# Patient Record
Sex: Male | Born: 1965 | Race: Black or African American | Hispanic: No | Marital: Married | Smoking: Never smoker
Health system: Southern US, Community
[De-identification: ages and names within clinical notes are randomized; demographics above are authoritative.]

## PROBLEM LIST (undated history)

## (undated) DIAGNOSIS — I10 Essential (primary) hypertension: Secondary | ICD-10-CM

## (undated) DIAGNOSIS — I119 Hypertensive heart disease without heart failure: Secondary | ICD-10-CM

## (undated) DIAGNOSIS — G47419 Narcolepsy without cataplexy: Secondary | ICD-10-CM

## (undated) DIAGNOSIS — K219 Gastro-esophageal reflux disease without esophagitis: Secondary | ICD-10-CM

## (undated) DIAGNOSIS — R0600 Dyspnea, unspecified: Secondary | ICD-10-CM

## (undated) DIAGNOSIS — G629 Polyneuropathy, unspecified: Secondary | ICD-10-CM

## (undated) DIAGNOSIS — Z9989 Dependence on other enabling machines and devices: Secondary | ICD-10-CM

## (undated) DIAGNOSIS — I422 Other hypertrophic cardiomyopathy: Secondary | ICD-10-CM

## (undated) DIAGNOSIS — R5383 Other fatigue: Secondary | ICD-10-CM

## (undated) DIAGNOSIS — G4733 Obstructive sleep apnea (adult) (pediatric): Secondary | ICD-10-CM

## (undated) DIAGNOSIS — Z9889 Other specified postprocedural states: Secondary | ICD-10-CM

## (undated) HISTORY — DX: Other fatigue: R53.83

## (undated) HISTORY — DX: Narcolepsy without cataplexy: G47.419

## (undated) HISTORY — DX: Other specified postprocedural states: Z98.890

## (undated) HISTORY — DX: Dyspnea, unspecified: R06.00

## (undated) HISTORY — PX: CATARACT EXTRACTION: SUR2

---

## 1998-02-26 HISTORY — PX: VASECTOMY: SHX75

## 2001-11-06 ENCOUNTER — Ambulatory Visit (HOSPITAL_BASED_OUTPATIENT_CLINIC_OR_DEPARTMENT_OTHER): Admission: RE | Admit: 2001-11-06 | Discharge: 2001-11-06 | Payer: Self-pay | Admitting: *Deleted

## 2002-01-11 ENCOUNTER — Encounter: Payer: Self-pay | Admitting: *Deleted

## 2002-01-12 ENCOUNTER — Ambulatory Visit (HOSPITAL_COMMUNITY): Admission: RE | Admit: 2002-01-12 | Discharge: 2002-01-13 | Payer: Self-pay | Admitting: *Deleted

## 2003-08-19 ENCOUNTER — Encounter: Admission: RE | Admit: 2003-08-19 | Discharge: 2003-08-19 | Payer: Self-pay | Admitting: Family Medicine

## 2010-06-28 DIAGNOSIS — G629 Polyneuropathy, unspecified: Secondary | ICD-10-CM

## 2010-06-28 HISTORY — DX: Polyneuropathy, unspecified: G62.9

## 2010-10-18 ENCOUNTER — Emergency Department (HOSPITAL_COMMUNITY): Payer: BC Managed Care – PPO

## 2010-10-18 ENCOUNTER — Inpatient Hospital Stay (HOSPITAL_COMMUNITY)
Admission: EM | Admit: 2010-10-18 | Discharge: 2010-10-26 | DRG: 018 | Disposition: A | Discharge status: Home Health | Payer: BC Managed Care – PPO | Attending: Internal Medicine | Admitting: Internal Medicine

## 2010-10-18 DIAGNOSIS — G4733 Obstructive sleep apnea (adult) (pediatric): Secondary | ICD-10-CM | POA: Diagnosis present

## 2010-10-18 DIAGNOSIS — R42 Dizziness and giddiness: Secondary | ICD-10-CM | POA: Diagnosis present

## 2010-10-18 DIAGNOSIS — E669 Obesity, unspecified: Secondary | ICD-10-CM | POA: Diagnosis present

## 2010-10-18 DIAGNOSIS — J342 Deviated nasal septum: Secondary | ICD-10-CM | POA: Diagnosis present

## 2010-10-18 DIAGNOSIS — R509 Fever, unspecified: Secondary | ICD-10-CM | POA: Diagnosis present

## 2010-10-18 DIAGNOSIS — G609 Hereditary and idiopathic neuropathy, unspecified: Principal | ICD-10-CM | POA: Diagnosis present

## 2010-10-18 DIAGNOSIS — I10 Essential (primary) hypertension: Secondary | ICD-10-CM | POA: Diagnosis present

## 2010-10-18 DIAGNOSIS — G729 Myopathy, unspecified: Secondary | ICD-10-CM | POA: Diagnosis present

## 2010-10-18 LAB — COMPREHENSIVE METABOLIC PANEL
ALT: 23 U/L (ref 0–53)
AST: 31 U/L (ref 0–37)
Albumin: 4.1 g/dL (ref 3.5–5.2)
CO2: 24 mEq/L (ref 19–32)
Calcium: 9.6 mg/dL (ref 8.4–10.5)
Creatinine, Ser: 1.51 mg/dL — ABNORMAL HIGH (ref 0.4–1.5)
GFR calc Af Amer: >60 mL/min (ref >60)
GFR calc non Af Amer: 51 mL/min — ABNORMAL LOW (ref >60)
Sodium: 134 mEq/L — ABNORMAL LOW (ref 135–145)

## 2010-10-18 LAB — CBC
HCT: 41.3 % (ref 39.0–52.0)
Hemoglobin: 14.2 g/dL (ref 13.0–17.0)
MCH: 27.4 pg (ref 26.0–34.0)
MCHC: 34.4 g/dL (ref 30.0–36.0)
MCV: 79.7 fL (ref 78.0–100.0)
Platelets: 355 10*3/uL (ref 150–400)
RBC: 5.18 MIL/uL (ref 4.22–5.81)
RDW: 12.2 % (ref 11.5–15.5)
WBC: 13.1 10*3/uL — ABNORMAL HIGH (ref 4.0–10.5)

## 2010-10-18 LAB — TROPONIN I: Troponin I: 0.02 ng/mL (ref 0.00–0.06)

## 2010-10-18 LAB — URINALYSIS, ROUTINE W REFLEX MICROSCOPIC
Bilirubin Urine: NEGATIVE
Leukocytes, UA: NEGATIVE
Nitrite: NEGATIVE
Specific Gravity, Urine: 1.012 (ref 1.005–1.030)
Urobilinogen, UA: 0.2 mg/dL (ref 0.0–1.0)
pH: 5.5 (ref 5.0–8.0)

## 2010-10-18 LAB — DIFFERENTIAL
Basophils Absolute: 0 10*3/uL (ref 0.0–0.1)
Basophils Relative: 0 % (ref 0–1)
Eosinophils Absolute: 0 10*3/uL (ref 0.0–0.7)
Eosinophils Relative: 0 % (ref 0–5)
Lymphocytes Relative: 10 % — ABNORMAL LOW (ref 12–46)
Lymphs Abs: 1.3 10*3/uL (ref 0.7–4.0)
Monocytes Absolute: 1.6 10*3/uL — ABNORMAL HIGH (ref 0.1–1.0)
Monocytes Relative: 12 % (ref 3–12)
Neutro Abs: 10.2 10*3/uL — ABNORMAL HIGH (ref 1.7–7.7)
Neutrophils Relative %: 78 % — ABNORMAL HIGH (ref 43–77)

## 2010-10-18 LAB — POCT CARDIAC MARKERS
Myoglobin, poc: 208 ng/mL (ref 12–200)
Troponin i, poc: <0.05 ng/mL (ref 0.00–0.09)

## 2010-10-18 LAB — MAGNESIUM: Magnesium: 2.4 mg/dL (ref 1.5–2.5)

## 2010-10-18 LAB — URINE MICROSCOPIC-ADD ON

## 2010-10-18 LAB — CK TOTAL AND CKMB (NOT AT ARMC): Relative Index: 1.2 (ref 0.0–2.5)

## 2010-10-18 LAB — HEMOGLOBIN A1C: Mean Plasma Glucose: 126 mg/dL — ABNORMAL HIGH (ref <117)

## 2010-10-18 MED ORDER — GADOBENATE DIMEGLUMINE 529 MG/ML IV SOLN
20.0000 mL | Freq: Once | INTRAVENOUS | Status: AC | PRN
  Administered 2010-10-18: 20 mL via INTRAVENOUS

## 2010-10-18 NOTE — Consult Note (Signed)
NAMEMAIKEL, NEISLER                 ACCOUNT NO.:  [id number]  MEDICAL RECORD NO.:  [id number]           PATIENT TYPE:  E  LOCATION:  [facility]                         FACILITY:  [facility]  PHYSICIAN:  Levie Heritage, MD       DATE OF BIRTH:  September 20, 1965  DATE OF CONSULTATION:  10/18/2010 DATE OF DISCHARGE:                                CONSULTATION   REFERRING PHYSICIAN:  Dr. Dellia Nims.  REASON FOR CONSULTATION:  Leg weakness.  CHIEF COMPLAINT:  Leg weakness.  HISTORY OF PRESENT ILLNESS:  The patient is a 45 year old African American man with no significant past medical history who has been having respiratory tract infection like picture for almost a week now. On this Friday, October 16, 2010, he noted generalized weakness and went to his primary care physician on 21st and was told to have some pneumonia like picture for what he was treated as well.  As per the wife and the patient, since then he has been having problem walking and feel generalized weak all over in addition to being a little confused as well.  There is also some issue with the bladder control problem that started yesterday and that brought him into the emergency department.  A CT scan of the brain was performed that is not showing any acute intracranial abnormality.  Currently, the patient denies any sensory deficits.  He did have generalized weakness throughout this course as above and has also vomited one time.  He states in addition to generalized weakness, he feels more weaker in his legs than the rest, however, denies any visual or sensory issues.  PAST MEDICAL HISTORY:  The patient has obstructive sleep apnea, nasal deviation, and inferior turbinate hypertrophy and he uses a CPAP at bedtime.  MEDICATIONS:  No regular outpatient medications.  ALLERGIES:  No allergies to any medications.  SOCIAL HISTORY:  Currently unemployed, has 2 children, lives with wife. There is no history of smoking, alcohol, or other  illicit drug abuse.  FAMILY HISTORY:  There is no nerve or muscle disorders in the family.  REVIEW OF SYSTEMS:  Currently denies any pain but feels lethargic all over.  Denies any shortness of breath.  Denies any chest pain.  Denies any yellowness of his skin.  Denies any rashes.  He does have fevers for almost a week now with chills and febrile.  No problem hearing.  No issues with the vision.  He vomited one time.  Denies any loose stools. The rest of 10 organ review of system unremarkable except for those mentioned above.  REVIEW OF CLINICAL DATA:  I have reviewed his CT scan of the head and have found no acute abnormality.  I have reviewed his labs suggestive of increased WBC counts and a CMP showing increased creatinine and glucose. The UA does not suggest of any urinary tract infection.  Has a normal value of magnesium.  PHYSICAL EXAMINATION:  GENERAL:  Comfortably lying down on the bed currently with vitals 128/68 mmHg and a pulse of 76 per minute.  Awake, oriented x3.  No acute distress. HEENT:  Bilateral pupils reactive to  light and accommodation with no field cut.  Moves eyes to all direction.  Symmetrical face within poor sensation and strength testing.  Midline tongue without atrophy or fasciculation.  Palate elevates symmetrically bilaterally. NEUROLOGIC:  Shoulder shrug intact both sides.  Motor:  The strength is intact in both arms, 5/5.  The strength in the legs is 1-2/5 ranges, however, there is lack of efforts noted on the Hoover sign as well. However, weakness from the dorsal ganglion would also give the similar picture from the lumbosacral rootlets.  Deep tendon reflexes are brisker, 3+ all over.  Sensory:  Feels intact light touch all over symmetrically without any alteration of sensation. Gait was not tested.  IMPRESSION:  This is a 45 year old man with 1 week history of respiratory tract infection with fevers, chills, and generalized weakness more so in the  legs than the rest. The intact deep tendon reflexes argue against the GBS, however, the intact sensation is all over and the lack of a sensory level localizes the lesion at the dorsal root ganglion or proximal to that but outside the spinal cord column. F - waves latency prolongation on the nerve conduction studies could better localize the lesion at this time, however, it is not performed as an inpatient.  The possibility of acute inflammatory demyelinating neuropathy cannot be ruled out in this setting, although the reflexes are intact.  However, infectious etiology is major differential as compared to the immune mediator and the management should be directed towards that at this time.  PLAN:  I have discussed the details of impression with Dr. Lovell Sheehan and have suggested getting the MRI of the lumbosacral spine with contrast. I have suggested to perform lumbar puncture for the differential cell counts, protein, glucose in addition to the infectious workup. Neurology will follow the patient with you as the case progresses. the followup on strength testing examination along with the deep tendon reflexes and above lab work will shed more light on the possibility of the GBS, however, at this point we will hold the treatment for that.          ______________________________ Levie Heritage, MD     WS/MEDQ  D:  10/18/2010  T:  10/18/2010  Job:  [id number]  Electronically Signed by Levie Heritage MD on 10/18/2010 02:05:34 PM

## 2010-10-19 ENCOUNTER — Inpatient Hospital Stay (HOSPITAL_COMMUNITY): Payer: BC Managed Care – PPO

## 2010-10-19 LAB — CBC
HCT: 41.3 % (ref 39.0–52.0)
Hemoglobin: 13.6 g/dL (ref 13.0–17.0)
MCH: 27.1 pg (ref 26.0–34.0)
RBC: 5.02 MIL/uL (ref 4.22–5.81)

## 2010-10-19 LAB — DIFFERENTIAL
Basophils Absolute: 0 10*3/uL (ref 0.0–0.1)
Basophils Relative: 0 % (ref 0–1)
Eosinophils Absolute: 0.1 10*3/uL (ref 0.0–0.7)
Lymphocytes Relative: 25 % (ref 12–46)
Monocytes Relative: 12 % (ref 3–12)
Neutro Abs: 5.9 10*3/uL (ref 1.7–7.7)
Neutrophils Relative %: 62 % (ref 43–77)

## 2010-10-19 LAB — URINE CULTURE: Culture: NO GROWTH

## 2010-10-20 ENCOUNTER — Inpatient Hospital Stay (HOSPITAL_COMMUNITY): Payer: BC Managed Care – PPO

## 2010-10-20 DIAGNOSIS — G8220 Paraplegia, unspecified: Secondary | ICD-10-CM

## 2010-10-20 DIAGNOSIS — R509 Fever, unspecified: Secondary | ICD-10-CM

## 2010-10-20 LAB — BASIC METABOLIC PANEL
BUN: 12 mg/dL (ref 6–23)
CO2: 31 mEq/L (ref 19–32)
Chloride: 100 mEq/L (ref 96–112)
Creatinine, Ser: 0.95 mg/dL (ref 0.4–1.5)
Glucose, Bld: 104 mg/dL — ABNORMAL HIGH (ref 70–99)

## 2010-10-20 LAB — DIFFERENTIAL
Lymphs Abs: 2.3 10*3/uL (ref 0.7–4.0)
Monocytes Absolute: 0.9 10*3/uL (ref 0.1–1.0)
Monocytes Relative: 11 % (ref 3–12)
Neutro Abs: 4.7 10*3/uL (ref 1.7–7.7)
Neutrophils Relative %: 59 % (ref 43–77)

## 2010-10-20 LAB — CBC
Hemoglobin: 14 g/dL (ref 13.0–17.0)
MCH: 27.4 pg (ref 26.0–34.0)
MCHC: 33.2 g/dL (ref 30.0–36.0)
MCV: 82.6 fL (ref 78.0–100.0)
RBC: 5.11 MIL/uL (ref 4.22–5.81)

## 2010-10-20 LAB — CSF CELL COUNT WITH DIFFERENTIAL
Monocyte-Macrophage-Spinal Fluid: 1 % — ABNORMAL LOW (ref 15–45)
RBC Count, CSF: 3 /mm3 — ABNORMAL HIGH
Tube #: 1

## 2010-10-20 MED ORDER — GADOBENATE DIMEGLUMINE 529 MG/ML IV SOLN
20.0000 mL | Freq: Once | INTRAVENOUS | Status: AC | PRN
  Administered 2010-10-20: 20 mL via INTRAVENOUS

## 2010-10-21 ENCOUNTER — Inpatient Hospital Stay (HOSPITAL_COMMUNITY): Payer: BC Managed Care – PPO

## 2010-10-21 LAB — PATHOLOGIST SMEAR REVIEW

## 2010-10-21 LAB — HIV ANTIBODY (ROUTINE TESTING W REFLEX): HIV: NONREACTIVE

## 2010-10-21 LAB — HERPES SIMPLEX VIRUS(HSV) DNA BY PCR: HSV 2 DNA: NOT DETECTED

## 2010-10-22 LAB — HIV-1 RNA ULTRAQUANT REFLEX TO GENTYP+
HIV 1 RNA Quant: <20 copies/mL (ref <20)
HIV-1 RNA Quant, Log: <1.3 {Log} (ref <1.30)

## 2010-10-23 DIAGNOSIS — G92 Toxic encephalopathy: Secondary | ICD-10-CM

## 2010-10-23 DIAGNOSIS — G929 Unspecified toxic encephalopathy: Secondary | ICD-10-CM

## 2010-10-23 DIAGNOSIS — R5381 Other malaise: Secondary | ICD-10-CM

## 2010-10-23 LAB — CK TOTAL AND CKMB (NOT AT ARMC)
Relative Index: INVALID (ref 0.0–2.5)
Total CK: 64 U/L (ref 7–232)

## 2010-10-23 LAB — COMPREHENSIVE METABOLIC PANEL
CO2: 29 mEq/L (ref 19–32)
Calcium: 9.5 mg/dL (ref 8.4–10.5)
Creatinine, Ser: 0.86 mg/dL (ref 0.4–1.5)
GFR calc non Af Amer: >60 mL/min (ref >60)
Glucose, Bld: 96 mg/dL (ref 70–99)
Total Protein: 7.3 g/dL (ref 6.0–8.3)

## 2010-10-23 LAB — CYTOMEGALOVIRUS PCR, QUALITATIVE: Cytomegalovirus DNA: NOT DETECTED

## 2010-10-23 LAB — CSF CULTURE W GRAM STAIN: Culture: NO GROWTH

## 2010-10-23 LAB — ANGIOTENSIN CONVERTING ENZYME: Angiotensin-Converting Enzyme: 42 U/L (ref 8–52)

## 2010-10-23 LAB — SEDIMENTATION RATE: Sed Rate: 37 mm/hr — ABNORMAL HIGH (ref 0–16)

## 2010-10-23 LAB — PSA: PSA: 0.62 ng/mL (ref <=4.00)

## 2010-10-23 LAB — C-REACTIVE PROTEIN: CRP: 1 mg/dL — ABNORMAL HIGH (ref <0.6)

## 2010-10-23 LAB — VITAMIN B12: Vitamin B-12: 756 pg/mL (ref 211–911)

## 2010-10-24 ENCOUNTER — Inpatient Hospital Stay (HOSPITAL_COMMUNITY): Payer: BC Managed Care – PPO

## 2010-10-24 LAB — CULTURE, BLOOD (ROUTINE X 2)
Culture  Setup Time: 201204221124
Culture: NO GROWTH

## 2010-10-24 LAB — COPPER, SERUM: Copper: 151 ug/dL (ref 70–175)

## 2010-10-26 LAB — B. BURGDORFI ANTIBODIES: B burgdorferi Ab IgG+IgM: 0.16 {ISR}

## 2010-10-26 NOTE — Consult Note (Signed)
Kyle Cross, Kyle Cross                 ACCOUNT NO.:  [id number]  MEDICAL RECORD NO.:  [id number]           PATIENT TYPE:  I  LOCATION:  3738                         FACILITY:  [facility]  PHYSICIAN:  Acey Lav, MD  DATE OF BIRTH:  09/02/1965  DATE OF CONSULTATION:  10/20/2010 DATE OF DISCHARGE:                                CONSULTATION   REQUESTING PHYSICIAN:  Altha Harm, MD.  REASON FOR INFECTION DISEASE CONSULTATION:  The patient with lower extremity paralysis, fevers, and intermittent confusion.  HISTORY OF PRESENT ILLNESS:  Kyle Cross is a 45 year old African American gentleman with past medical history significant for obstructive sleep apnea and hypertension, who had been doing relatively well until approximately 10 days prior to admission, at which point in time, he developed fevers, chills, myalgias, and malaise with a nonproductive cough.  He and his wife thought that he had the flu and he was being treated with antipyretics including ibuprofen and acetaminophen.  This improved over the weekend, and by the Monday and Tuesday, he was feeling better and began mowing his grass.  He then again felt poorly and noticed that he had again become febrile.  He previously had temperature of 102 degrees Fahrenheit, now is again up to 100 and 102 degrees. Malaise persisted, and by Saturday April 21, he had noticed that he was having difficulty with his balance and was feeling dizzy on his feet. He sought care with his primary care physician at [facility], who performed a chest x-ray.  Chest x-ray was apparently read as being consistent with some bronchitis and he was given a dose of Rocephin intramuscularly along with oral Avelox, which he took on Saturday.  Symptoms worsened on Sunday and he fell at home twice.  Both of these were due to his strength giving out on him.  He said that he already had been leaning up against the wall to try and support him.  Finally, when he had  fallen the last time, his wife had found him on the floor near the bed.  She called 911 and brought him to the hospital urgently.  He was admitted to the Hospitalist Service and was seen by Neurology as well.  His admission labs were pertinent for mild leukocytosis and slight renal insufficiency, otherwise relatively unremarkable labs.  He had a noncontrasted CT of the head done which did not show any acute intracranial abnormalities.  He had a chest x-ray done, which showed some mild cardiomegaly and vascular congestion.  He underwent an MRI of the lumbar spine without and with contrast that showed some disk bulging of the facet ligamentous hypertrophy from L3-4 and L5-S1, but no disk herniation or stenosis or nerve root encroachment.  There is no evidence of diskitis at all.  Ultimately, he underwent a lumbar puncture today under fluoroscopy.  Opening pressure was 17 cm of fluid and 8 mL of spinal fluid was removed.  Spinal fluid composition revealed 145 white blood cells with 3 red cells with 99% lymphocytes, 1% monocytes.  His cryptococcal antigen in spinal fluid was negative.  Protein in spinal fluid was 79 and  glucose was normal at 61.  Serum glucose today was 104. Since admission to the hospital, the patient has been completely afebrile.  He was found on admission to have urinary retention, had a catheter placed.  His lower extremity weakness does not appear to have improved at all since admission.  We were asked to see the patient and assist in workup of this patient with history of recent febrile illness and onset of lower extremity paralysis.  PAST MEDICAL HISTORY: 1. Obstructive sleep apnea with CPAP that he uses at night. 2. Nasal deviation with inferior turbinate hypertrophy. 3. Hypertension.  PAST SURGICAL HISTORY:  None.  ALLERGIES:  No known drug allergy.  MEDICATIONS:  Currently, the patient is on amlodipine, Tylenol, metoprolol, and Zofran.  REVIEW OF SYSTEMS:   As described in history of present illness, otherwise 12-point review of systems is negative.  PHYSICAL EXAMINATION:  VITAL SIGNS:  Temperature maximum is 98.2 in the last 24 hours, temperature current 97.9, blood pressure 122/79, pulse 67, respirations 22, pulse ox is 93% on room air. GENERAL:  Pleasant gentleman, who is overweight. HEENT:  Normocephalic.  His pupils are equal, round, reactive to light. His sclerae are anicteric.  His conjunctivae are clear.  His oropharynx is clear without exudate or lesions. NECK:  Thick.  No significant cervical lymphadenopathy. CARDIOVASCULAR:  Reveals a regular rate and rhythm.  No murmurs, gallops, or rubs heard. LUNGS:  Relatively clear to auscultation.  He did have some few coarse upper airway sounds that sounded more than his neck on examination initially. ABDOMEN:  Soft with some mild suprapubic tenderness that he thinks is due to the Foley catheter not being placed right. SKIN:  He has some areas where he has been scratched, but no significant rashes or petechiae identified.  No splinter hemorrhages or Janeway lesions. NEUROLOGIC:  His cranial nerves II through XII are grossly intact.  Hiscerebellar function is intact by finger-to-nose testing.  His upper extremity strength is 5/5 bilaterally.  His hip flexion is actually 3/5 on the right 2/5 on the left.  Similarly, flexion about the knee is 3/5 in the right and 2/5 in the left.  Dorsiflexion and extension are 3/5 bilaterally.  Sensory exam, he has hyperesthesia in his palms of the feet and dorsal aspect of his feet, but normal sensation above this level.  His deep tendon reflexes are 3+ throughout.  LABORATORY DATA:  Images as described above.  Spinal fluid composition as described above.  Metabolic panel today, sodium 140, potassium 3.8, chloride 100, bicarb 31, BUN and creatinine 12 and 0.95, glucose 104.  CBC differential, white count 8, hemoglobin 14, platelets 334, ANC of 4.7,  ALC of 2.3.  Thyroid stimulating hormone 0.825.  Urinalysis showed negative urinalysis.  Cardiac biomarkers were negative.  Liver function tests were normal on admission.  Microbiological data, urine culture on April 22, no growth.  Blood cultures in one site, no growth.  IMPRESSION AND RECOMMENDATIONS:  This is a 45 year old African American gentleman with febrile illness, occurred 10 days ago, that persisted and then developed into lower extremity paralysis. 1. Lower extremity paralysis in the setting of recent febrile illness:     Differential for this certainly would include viral infection such     as arboviruses including [place] Nile virus, [place] encephalitis     virus, eastern equine encephalitis virus, western equine     encephalitis virus, and [place] equine encephalitis virus,     although some of these are less common in  this area.  Certainly, an     enterovirus infection is also a possibility.  Treatable herpes     viruses would be possible, but would seem did not be very likely     herpes simplex upon can certainly cause encephalitis, although, the     picture here with his lower extremity paralysis does not consistent     with a typical presentation of herpes simplex encephalitis.  Herpes     simplex type 2, which would present with meningitis.  Varicella     zoster virus certainly could do this, and in the appropriate     setting, he does lacks atypical antecedent eruption of zoster.     Should he have human immunodeficiency virus, then cytomegalovirus     infection would be possible.  Certainly, CNS pleocytosis can occur     in the setting of tick-borne infection such as [place]     spotted fever and Ehrlichia, however, given lack of significant lab     abnormalities such as thrombocytopenia, transaminase elevation, and     the resolution of his fever, I would find these to be unlikely.     Bacterial infection is highly unlikely given the fact that he  has     gone on for approximately 12 days without deteriorating in the     absence of significant antibiotic exposure.  It is in the realm of     possibility, it could have apparent meningeal focus of infection     such as a brain abscess or abscess higher up in the cord     potentially in the thoracic level.  Certainly, neurosyphilis could     potentially this as well.  Acute human immunodeficiency virus could     as well.  Other possibilities would be a post infectious pathology     such as acute demyelinating encephalomyelitis or Guillain-Barre     syndrome.  Other noninfectious entities such as MS.  I would     recommend getting a stat MRI of the brain as this might elucidate     possible pathology and differentiate between a possible post-     infectious process such as acute disseminated encephalomyelitis     versus evidence of viral encephalitis such as cytomegalovirus     infection, or herpes simplex type 1 infection.  I will send his     spinal fluid up which there is approximately 10 mL left off first     for herpes simplex by PCR, varicella zoster by PCR, and     cytomegalovirus by PCR.  I will check a stat serum, RPR with     delusions of present effect.  I will check a stat HIV, antibody,     and RNA.  At this point in time, I would like to defer giving him     antiviral therapy as my suspicion is low for herpes simplex     encephalitis and low for cytomegalovirus infection.  Certainly,     should his HIV test be positive or should the MRI be highly     suggestive of viral encephalitis, I would consider starting him on     ganciclovir for coverage for CMV-HCA and VZV versus acyclovir for     HSV and VZV.  We can send a serum panel for arbovirus, and then we     could send the spinal fluid if we have, and even at this point in     time,  however, I would like to hold off on sending such testing as     these would not be treatable with antiviral therapies.  Thank you for  Infectious Disease consultation.  We will continue to follow along.     Acey Lav, MD     CV/MEDQ  D:  10/20/2010  T:  10/21/2010  Job:  [id number]  cc:   Levie Heritage, MD  Electronically Signed by Paulette Blanch DAM MD on 10/26/2010 08:58:41 PM

## 2010-11-04 LAB — ARBOVIRUS PANEL, ~~LOC~~ LAB

## 2010-11-04 NOTE — H&P (Signed)
NAMEGIOVONI, BUNCH                 ACCOUNT NO.:  [id number]  MEDICAL RECORD NO.:  [id number]           PATIENT TYPE:  I  LOCATION:  3738                         FACILITY:  [facility]  PHYSICIAN:  Altha Harm, MDDATE OF BIRTH:  06/11/1966  DATE OF ADMISSION:  10/18/2010 DATE OF DISCHARGE:                             HISTORY & PHYSICAL   CHIEF COMPLAINT:  Dizziness, disorientation, fevers x1 week.  HISTORY OF PRESENT ILLNESS:  Kyle Cross is a 45 year old gentleman who presents to the emergency room with paraplegia of sudden onset. According to the patient's wife, the patient has been having fevers for 1 week and was seen by his primary care physician and received a shot of Rocephin, also prescription for Avelox on yesterday to treat his upper respiratory infection.  According to the patient's wife, the patient was in his usual state of strength, ambulating and even working on his riding lawn more up until the day before presentation.  However, the patient on the day before noticed he was having some weakness and on the morning of presentation, felt that he was unable to move his legs or stand.  EMS was called and the patient was brought to the emergency room.  The patient's wife relates a fever as high as 103.  Fever was treated with Motrin with good results.  The patient also had some urinary retention for over 24 hours which was relieved with a Foley catheter placed in the emergency room.  The patient denies any rashes. He denies any weight loss or weight gain.  He denies any nausea, vomiting, or diarrhea.  He denies any chest pain.  The patient did sustain a fall on the morning of presentation secondary to the weakness of his bilateral lower extremities.  PAST MEDICAL HISTORY:  Significant obstructive sleep apnea.  FAMILY HISTORY:  Hypertension in the mother, diabetes in his father.  SOCIAL HISTORY:  The patient is currently unemployed.  He resides with his wife Ava, who is  present at the bedside.  There is no tobacco, alcohol, or drug use.  Medications at home include the following: 1. Multivitamin 1 tab p.o. daily. 2. Ibuprofen 200-400 mg p.o. q.8 hours p.r.n. 3. Avelox 400 mg p.o. daily.  The patient took his first dose last     night, has not taken any further medication since.  ALLERGIES:  No known drug allergies.  PRIMARY CARE PHYSICIAN:  Bryan Lemma. Manus Gunning, MD, at [place].  REVIEW OF SYSTEMS:  All other systems negative except as noted in the HPI.  STUDIES IN THE EMERGENCY ROOM: 1. CT of the head was negative for any acute intracranial     abnormalities. 2. Chest x-ray is negative for any acute cardiopulmonary process.  Hemogram shows a white blood cell count of 13.1, hemoglobin of 14.2, hematocrit of 41.3, platelet count of 355.  Sodium is 134, potassium 4.2, chloride 99, bicarb 24, BUN 70, creatinine 1.51.  PHYSICAL EXAMINATION:  GENERAL:  The patient is laying in bed.  He is easily arousable, but has a very distant affect and is not eager to participate in conversation. VITAL SIGNS:  Temperature  is 98, heart rate 83, blood pressure 185/81, respiratory rate 21, O2 sats are 95% on room air. HEENT:  Normocephalic, atraumatic.  Pupils equally round and reactive to light and accommodation.  Fundi are benign.  Extraocular movements are intact.  Oropharynx is moist with no exudate, erythema, or lesions are noted. NECK:  Trachea is midline.  No masses, no thyromegaly, no JVD.  No carotid bruit. RESPIRATORY:  The patient has normal respiratory effort, equal excursion bilaterally.  No wheezes, no rhonchi noted. CARDIOVASCULAR:  He has got a normal S1 and S2.  No murmurs, rubs, or gallops are noted.  PMI is nondisplaced.  No heaves or thrills on palpation. ABDOMEN:  Obese, soft, nontender, nondistended.  No masses, no hepatosplenomegaly noted. NEUROLOGICAL:  The patient has normal rectal tone.  DTR's are mildly hyperreflexic at 3+ in bilateral  lower and upper extremities.  The patient is unable to initiate movement in the lower extremity, however, does not appear to be exerting any effort.  The strength that he exhibits on examination is 1/5. MUSCULOSKELETAL:  Sensation is intact to light touch throughout the bilateral upper and lower extremities. LYMPH NODE SURVEY:  He has got no cervical, axillary, inguinal lymphadenopathy noted. SKIN:  Shows no rashes.  It is warm and dry throughout.  ASSESSMENT AND PLAN:  This patient who presents with paraplegia of unknown etiology.  There is a question about whether this could represent Gullian-Barre.  However, the hyperreflexia seen in this patient is inconsistent with the presentation of Gullian-Barre.  Dr. Hoy Morn, Neurology has been consulted to see this patient and I will defer to his recommendations on the paraplegia.  The patient has had some urinary retention and although his rectal tone was within normal limits, I feel it is important to be eliminate spinal pathology the cause of this.  I am ordering a thoracic lumbosacral MRI with and without contrast to further evaluate the spine.  The patient has been noted to have had some fevers if the MRI does not reveal any implicating pathology, then I will proceed with a lumbar puncture to evaluate CSF for any intracerebral causes of this.  Hypertension.  The patient is not known to have a diagnosis of hypertension, however, he is hypertensive here and we will start with injectables at this point and if the hypertension process will proceed with oral medications to better control his blood pressures.  Right now, the patient is afebrile and I do not see an indication for continuing any antibiotics as the patient exhibits no signs of an upper respiratory infection or any other overt infection.  I think that until the patient has a lumbar puncture, it will be prudent to hold off on any antibiotics unless the patient deteriorates  clinically and empiric antibiotics to cover intracranial pathogens would be started. The patient will receive Lovenox for DVT prophylaxis.     Altha Harm, MD     MAM/MEDQ  D:  10/20/2010  T:  10/20/2010  Job:  [id number]  cc:   Levie Heritage, MD Bryan Lemma. Manus Gunning, M.D.  Electronically Signed by Marthann Schiller MD on 11/04/2010 02:13:03 PM

## 2010-11-18 NOTE — Discharge Summary (Signed)
NAMEJERL, MUNYAN                 ACCOUNT NO.:  [id number]  MEDICAL RECORD NO.:  [id number]           PATIENT TYPE:  I  LOCATION:  3738                         FACILITY:  [facility]  PHYSICIAN:  Charlise Giovanetti I Odessa Morren, MD      DATE OF BIRTH:  December 14, 1965  DATE OF ADMISSION:  10/18/2010 DATE OF DISCHARGE:  10/26/2010                              DISCHARGE SUMMARY   DISCHARGE DIAGNOSES: 1. 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 2. Obstructive sleep apnea, on CPAP. 3. Nasal deviation with inferior turbinate hypertrophy. 4. Hypertension. 5. Obesity.  CONSULTATION:  Infectious Disease consulted, done by Dr. Daiva Eves and neuro consulted.  PROCEDURES: 1. Chest x-ray, mild cardiomegaly, mild vascular congestion. 2. CT head without contrast unremarkable noncontrast CT of the head. 3. MRI of the lumbar spine, mild disk bulging with facet and     ligamentous hypertrophy from L3-L4 through L5-S1.  No disk  herniation, spinal stenosis, or nerve root encroachment.  Normal-     appearing distal thoracic cord.  No abnormal intradural     enhancement, no evidence of diskitis or paraspinal infection.     Nonspecific mildly decreased marrow signals throughout the spine     can be seen as normal gradient. 4. MRI of the brain with and without contrast, no acute abnormality,     normal MRI.  Mild bony and ligamentous stenosis at the     cervicomedullary junction, may be related either to degenerative     change at the odontoid and/or congenital shape. 5. Thoracic spine, mild thoracic spondylosis at T7-T8, T8-T9, and T9-     T10.  Flattening of the cord at T7-T8 and T9-T10. 6. MRI of the C-spine repeated twice per Neurology recommendation.     Congenital short pedicle.  Borderline central stenosis at multiple     levels, mild central stenosis at C3-4 due to paracentral disk     protrusion.  No significant core signal abnormalities identified.  HISTORY OF PRESENT  ILLNESS:  This is a 45 year old African American gentleman who has a past medical history significant for obstructive sleep apnea and hypertension who had been doing relatively well until approximately 10 days prior to admission at which point in time he developed fever, chills, myalgia, and malaise were discussed.  He and his wife felt he had a flu and was treated with antibiotics including Ibuprofen and Avelox.  He went to his primary care physician on 21st and was told to have some pneumonia-like picture for what he was treated as well.  Since then he has been having problem walking and felt generalized weak all over in addition to being a little confused as well.  Also there was some issue with bladder control and bowel started on 21st and it brought him to the emergency room.  A CT scan of the brain was performed that did not show any acute intracranial abnormality.  The patient denies any sensory deficit, but he complained of generalized weakness mainly on his legs.  The patient admitted for evaluation of his lower extremity weakness.  PROBLEM: 1. Lower  extremity weakness.  As we mentioned above, MRI of lumbar,     thoracic, and C-spine was negative, question was Guillain-Barre was     on the differential diagnosis, but the patient has really good     reflexes which contradicts the picture of Guillain-Barre.     Neurology did not feel the patient has any Guillain-Barre and     recommended Infectious Disease consultation for evaluation if there     is any underlying infection.  The patient seen and Varicella Zoster     PCR was negative.  HIV test was negative.  Herpes simplex virus was     negative and West Nile virus was negative.  Culture for acid-fast     bacilli was also negative.  Rapid plasma reagin was negative.     Cryptococcal antigen was negative.  The patient has abnormal CSF.     The patient did have CSF glucose of 61 and total protein of 79,     which is mildly  elevated.  Infectious Disease did not recommend any     specific medication as herpes simplex was negative per Neuro and     Infectious Disease, this could be Chad Nile virus, an airborne     infection verus both infectious syndrome.  No further     recommendation from Infectious Disease point of view, and the     patient remained afebrile during hospital stay.  The patient has     good peripheral pulse bilaterally.  Copper level was in good range,     not toxic level.  Per Neurology recommendation, the patient need to     continue physical therapy.  The patient significantly improved.  He     is able to move his lower extremity.  The patient refuses inpatient     rehab and would like to be discharged home with outpatient rehab.     Recommended Neurontin 100 mg p.o. b.i.d.  Recommend to follow up     with [facility] Neurology after discharge within 2 weeks.  Currently,     the patient denies any chest pain.  Denies any shortness of breath.     Denies any nausea or vomiting.  PHYSICAL EXAMINATION:  VITAL SIGNS:  Temperature 99.1, blood pressure 119/72, respiratory rate 18, pulse rate 80, saturating 98% on room air. HEENT:  Normocephalic and atraumatic.  Pupils equal, reactive to light and accommodation.  Extraocular muscle movement was normal. NECK:  Supple.  No lymphadenopathy. HEART:  S1 and S2 with no added sound. LUNGS:  Normal vesicular breathing with equal air entry. ABDOMEN:  Soft, nontender.  Bowel sounds positive. EXTREMITIES:  Power is 4/5 on his lower extremity with positive reflexes and no clonus and downgoing toe.  DISCHARGE MEDICATIONS: 1. Aspirin 81 mg p.o. daily. 2. Metoprolol 25 mg p.o. b.i.d. 3. Multivitamin 1 tab p.o. daily. 4. Neurontin 100 mg p.o. b.i.d.  The patient noticed to have an EKG with some T-wave inversion only at I, II, and AVL.  The patient denies any chest pain at this time and his cardiac enzymes are negative.  We will arrange followup with  [facility] Neurology within 2 weeks.  Also arrange to see Cardiology for abnormal EKG.     Terrin Meddaugh Bosie Helper, MD     HIE/MEDQ  D:  10/26/2010  T:  10/26/2010  Job:  [id number]  Electronically Signed by Ebony Cargo MD on 11/18/2010 03:55:02 PM

## 2010-12-03 LAB — AFB CULTURE WITH SMEAR (NOT AT ARMC): Acid Fast Smear: NONE SEEN

## 2011-03-25 ENCOUNTER — Encounter (HOSPITAL_BASED_OUTPATIENT_CLINIC_OR_DEPARTMENT_OTHER)
Admission: RE | Admit: 2011-03-25 | Discharge: 2011-03-25 | Disposition: A | Discharge status: Home / Self Care | Payer: BC Managed Care – PPO | Source: Ambulatory Visit | Attending: Specialist | Admitting: Specialist

## 2011-03-25 LAB — BASIC METABOLIC PANEL
BUN: 13 mg/dL (ref 6–23)
Calcium: 9.9 mg/dL (ref 8.4–10.5)
Creatinine, Ser: 0.94 mg/dL (ref 0.50–1.35)
GFR calc non Af Amer: >60 mL/min (ref >60)
Glucose, Bld: 98 mg/dL (ref 70–99)
Sodium: 141 mEq/L (ref 135–145)

## 2011-03-25 LAB — CBC
HCT: 40.8 % (ref 39.0–52.0)
Hemoglobin: 13.6 g/dL (ref 13.0–17.0)
MCH: 26.8 pg (ref 26.0–34.0)
MCHC: 33.3 g/dL (ref 30.0–36.0)

## 2011-03-25 LAB — DIFFERENTIAL
Basophils Relative: 0 % (ref 0–1)
Monocytes Absolute: 0.5 10*3/uL (ref 0.1–1.0)
Monocytes Relative: 11 % (ref 3–12)
Neutro Abs: 1.9 10*3/uL (ref 1.7–7.7)

## 2011-03-29 ENCOUNTER — Other Ambulatory Visit: Payer: Self-pay | Admitting: Specialist

## 2011-03-29 ENCOUNTER — Ambulatory Visit (HOSPITAL_BASED_OUTPATIENT_CLINIC_OR_DEPARTMENT_OTHER)
Admission: RE | Admit: 2011-03-29 | Discharge: 2011-03-29 | Disposition: A | Discharge status: Home / Self Care | Payer: BC Managed Care – PPO | Source: Ambulatory Visit | Attending: Specialist | Admitting: Specialist

## 2011-03-29 DIAGNOSIS — G4730 Sleep apnea, unspecified: Secondary | ICD-10-CM | POA: Insufficient documentation

## 2011-03-29 DIAGNOSIS — L738 Other specified follicular disorders: Secondary | ICD-10-CM | POA: Insufficient documentation

## 2011-03-29 DIAGNOSIS — N508 Other specified disorders of male genital organs: Secondary | ICD-10-CM | POA: Insufficient documentation

## 2011-03-29 DIAGNOSIS — I10 Essential (primary) hypertension: Secondary | ICD-10-CM | POA: Insufficient documentation

## 2011-03-29 DIAGNOSIS — Z01812 Encounter for preprocedural laboratory examination: Secondary | ICD-10-CM | POA: Insufficient documentation

## 2011-03-29 HISTORY — PX: SCROTAL EXPLORATION: SHX2386

## 2011-04-16 ENCOUNTER — Other Ambulatory Visit: Payer: Self-pay | Admitting: Specialist

## 2011-05-03 NOTE — Op Note (Signed)
  NAMEMIKA, ANASTASI                 ACCOUNT NO.:  [id number]  MEDICAL RECORD NO.:  [id number]  LOCATION:  3738                         FACILITY:  [facility]  PHYSICIAN:  Earvin Hansen L. Shon Hough, M.D.DATE OF BIRTH:  1965/07/01  DATE OF PROCEDURE:  03/29/2011 DATE OF DISCHARGE:  10/26/2010                              OPERATIVE REPORT   A 46 year old gentleman with folliculitis and enlarging constellation masses involving his right scrotum.  The areas have been resistant to steroid injection for relief and flattening increased pain, burning and itching.  PROCEDURES DONE:  Excision of the areas on en bloc with plastic closure, flap closure.  ANESTHESIA:  General supplemented by Xylocaine 1.5% with epinephrine 1:200000 concentration total of 20 mL.  The patient underwent general anesthesia intubated orally.  Prep was done to the groin penile areas and lower belly with Betadine soap and solution and walled off with sterile towels and drapes so as to make a sterile field.  I was able to outline the whole areas of constellation of lesions with a large bur shaver elliptical flap design.  We were able to make an incision with #15 blade down to underlying subcutaneous tissue, then dissection was carried off using the Bovie anticoagulation and cutting through the superficial tissue without invading any neurovascular structures. Hemostasis was maintained with the Bovie anticoagulation.  After this, the flaps were freed up.  Edges were injected with triamcinolone 0.10 mg/mL of total 1.5 mL to all the edges.  Flaps were closed, 2-0 Monocryl x2 layers and then a running subcuticular stitch of 3-0 Monocryl. Dressings were applied after they were cleansed and bond and cement was placed over the skin.  Sterile dressing then applied, withstood the procedures very well, was taken to recovery in excellent condition.     Yaakov Guthrie. Shon Hough, M.D.     Cathie Hoops  D:  03/29/2011  T:  03/30/2011  Job:   [id number]  Electronically Signed by Louisa Second M.D. on 05/03/2011 07:10:16 PM

## 2013-09-10 ENCOUNTER — Encounter: Payer: Self-pay | Admitting: Internal Medicine

## 2013-09-10 ENCOUNTER — Other Ambulatory Visit (HOSPITAL_COMMUNITY): Payer: Self-pay | Admitting: Family Medicine

## 2013-09-10 ENCOUNTER — Ambulatory Visit (HOSPITAL_COMMUNITY): Payer: BC Managed Care – PPO | Attending: Internal Medicine | Admitting: Cardiology

## 2013-09-10 DIAGNOSIS — I517 Cardiomegaly: Secondary | ICD-10-CM

## 2013-09-10 DIAGNOSIS — I379 Nonrheumatic pulmonary valve disorder, unspecified: Secondary | ICD-10-CM | POA: Insufficient documentation

## 2013-09-10 DIAGNOSIS — I059 Rheumatic mitral valve disease, unspecified: Secondary | ICD-10-CM | POA: Insufficient documentation

## 2013-09-10 NOTE — Progress Notes (Signed)
Echo performed. 

## 2013-12-10 ENCOUNTER — Ambulatory Visit
Admission: RE | Admit: 2013-12-10 | Discharge: 2013-12-10 | Disposition: A | Discharge status: Home / Self Care | Payer: BC Managed Care – PPO | Source: Ambulatory Visit | Attending: Family Medicine | Admitting: Family Medicine

## 2013-12-10 ENCOUNTER — Other Ambulatory Visit: Payer: Self-pay | Admitting: Family Medicine

## 2013-12-10 DIAGNOSIS — M533 Sacrococcygeal disorders, not elsewhere classified: Secondary | ICD-10-CM

## 2014-05-07 ENCOUNTER — Inpatient Hospital Stay (HOSPITAL_COMMUNITY)
Admission: EM | Admit: 2014-05-07 | Discharge: 2014-05-08 | DRG: 287 | Disposition: A | Discharge status: Home / Self Care | Payer: BC Managed Care – PPO | Attending: Interventional Cardiology | Admitting: Interventional Cardiology

## 2014-05-07 ENCOUNTER — Encounter (HOSPITAL_COMMUNITY): Payer: Self-pay | Admitting: Cardiology

## 2014-05-07 ENCOUNTER — Encounter (HOSPITAL_COMMUNITY)
Admission: EM | Disposition: A | Discharge status: Home / Self Care | Payer: Self-pay | Source: Home / Self Care | Attending: Interventional Cardiology

## 2014-05-07 DIAGNOSIS — Z8249 Family history of ischemic heart disease and other diseases of the circulatory system: Secondary | ICD-10-CM

## 2014-05-07 DIAGNOSIS — Z9989 Dependence on other enabling machines and devices: Secondary | ICD-10-CM | POA: Diagnosis present

## 2014-05-07 DIAGNOSIS — G4733 Obstructive sleep apnea (adult) (pediatric): Secondary | ICD-10-CM | POA: Diagnosis present

## 2014-05-07 DIAGNOSIS — Z833 Family history of diabetes mellitus: Secondary | ICD-10-CM | POA: Diagnosis not present

## 2014-05-07 DIAGNOSIS — I110 Hypertensive heart disease with heart failure: Secondary | ICD-10-CM | POA: Diagnosis present

## 2014-05-07 DIAGNOSIS — K219 Gastro-esophageal reflux disease without esophagitis: Secondary | ICD-10-CM | POA: Diagnosis present

## 2014-05-07 DIAGNOSIS — I119 Hypertensive heart disease without heart failure: Secondary | ICD-10-CM | POA: Insufficient documentation

## 2014-05-07 DIAGNOSIS — R079 Chest pain, unspecified: Secondary | ICD-10-CM | POA: Diagnosis not present

## 2014-05-07 DIAGNOSIS — E785 Hyperlipidemia, unspecified: Secondary | ICD-10-CM | POA: Diagnosis present

## 2014-05-07 DIAGNOSIS — I422 Other hypertrophic cardiomyopathy: Secondary | ICD-10-CM | POA: Diagnosis not present

## 2014-05-07 DIAGNOSIS — G629 Polyneuropathy, unspecified: Secondary | ICD-10-CM

## 2014-05-07 DIAGNOSIS — I5030 Unspecified diastolic (congestive) heart failure: Secondary | ICD-10-CM | POA: Diagnosis present

## 2014-05-07 DIAGNOSIS — I200 Unstable angina: Secondary | ICD-10-CM | POA: Diagnosis present

## 2014-05-07 DIAGNOSIS — I10 Essential (primary) hypertension: Secondary | ICD-10-CM | POA: Diagnosis present

## 2014-05-07 HISTORY — DX: Polyneuropathy, unspecified: G62.9

## 2014-05-07 HISTORY — DX: Hypertensive heart disease without heart failure: I11.9

## 2014-05-07 HISTORY — DX: Gastro-esophageal reflux disease without esophagitis: K21.9

## 2014-05-07 HISTORY — DX: Obstructive sleep apnea (adult) (pediatric): G47.33

## 2014-05-07 HISTORY — DX: Other hypertrophic cardiomyopathy: I42.2

## 2014-05-07 HISTORY — DX: Obstructive sleep apnea (adult) (pediatric): Z99.89

## 2014-05-07 HISTORY — PX: LEFT HEART CATHETERIZATION WITH CORONARY ANGIOGRAM: SHX5451

## 2014-05-07 HISTORY — DX: Essential (primary) hypertension: I10

## 2014-05-07 HISTORY — PX: CARDIAC CATHETERIZATION: SHX172

## 2014-05-07 LAB — URINALYSIS, ROUTINE W REFLEX MICROSCOPIC
BILIRUBIN URINE: NEGATIVE
GLUCOSE, UA: NEGATIVE mg/dL
KETONES UR: NEGATIVE mg/dL
Leukocytes, UA: NEGATIVE
Nitrite: NEGATIVE
Protein, ur: NEGATIVE mg/dL
Specific Gravity, Urine: 1.034 — ABNORMAL HIGH (ref 1.005–1.030)
UROBILINOGEN UA: 0.2 mg/dL (ref 0.0–1.0)
pH: 7 (ref 5.0–8.0)

## 2014-05-07 LAB — LIPID PANEL
CHOLESTEROL: 160 mg/dL (ref 0–200)
HDL: 48 mg/dL (ref >39)
LDL Cholesterol: 94 mg/dL (ref 0–99)
TRIGLYCERIDES: 88 mg/dL (ref <150)
Total CHOL/HDL Ratio: 3.3 RATIO
VLDL: 18 mg/dL (ref 0–40)

## 2014-05-07 LAB — POCT I-STAT, CHEM 8
BUN: 10 mg/dL (ref 6–23)
CALCIUM ION: 1.24 mmol/L — AB (ref 1.12–1.23)
CREATININE: 0.8 mg/dL (ref 0.50–1.35)
Chloride: 102 mEq/L (ref 96–112)
GLUCOSE: 106 mg/dL — AB (ref 70–99)
HCT: 44 % (ref 39.0–52.0)
HEMOGLOBIN: 15 g/dL (ref 13.0–17.0)
Potassium: 3.5 mEq/L — ABNORMAL LOW (ref 3.7–5.3)
SODIUM: 140 meq/L (ref 137–147)
TCO2: 24 mmol/L (ref 0–100)

## 2014-05-07 LAB — URINE MICROSCOPIC-ADD ON

## 2014-05-07 LAB — TROPONIN I
Troponin I: <0.3 ng/mL (ref <0.30)
Troponin I: <0.3 ng/mL (ref <0.30)

## 2014-05-07 LAB — COMPREHENSIVE METABOLIC PANEL
ALBUMIN: 3.9 g/dL (ref 3.5–5.2)
ALK PHOS: 65 U/L (ref 39–117)
ALT: 21 U/L (ref 0–53)
ANION GAP: 15 (ref 5–15)
AST: 19 U/L (ref 0–37)
BUN: 11 mg/dL (ref 6–23)
CHLORIDE: 101 meq/L (ref 96–112)
CO2: 24 mEq/L (ref 19–32)
Calcium: 9.6 mg/dL (ref 8.4–10.5)
Creatinine, Ser: 0.84 mg/dL (ref 0.50–1.35)
GFR calc Af Amer: >90 mL/min (ref >90)
GFR calc non Af Amer: >90 mL/min (ref >90)
Glucose, Bld: 101 mg/dL — ABNORMAL HIGH (ref 70–99)
POTASSIUM: 3.7 meq/L (ref 3.7–5.3)
SODIUM: 140 meq/L (ref 137–147)
TOTAL PROTEIN: 7 g/dL (ref 6.0–8.3)
Total Bilirubin: 0.3 mg/dL (ref 0.3–1.2)

## 2014-05-07 LAB — CBC
HEMATOCRIT: 39.9 % (ref 39.0–52.0)
HEMOGLOBIN: 13.7 g/dL (ref 13.0–17.0)
MCH: 27.5 pg (ref 26.0–34.0)
MCHC: 34.3 g/dL (ref 30.0–36.0)
MCV: 80.1 fL (ref 78.0–100.0)
Platelets: 261 10*3/uL (ref 150–400)
RBC: 4.98 MIL/uL (ref 4.22–5.81)
RDW: 12.5 % (ref 11.5–15.5)
WBC: 5.4 10*3/uL (ref 4.0–10.5)

## 2014-05-07 LAB — APTT: APTT: 33 s (ref 24–37)

## 2014-05-07 LAB — PROTIME-INR
INR: 1.01 (ref 0.00–1.49)
Prothrombin Time: 13.4 seconds (ref 11.6–15.2)

## 2014-05-07 LAB — CK TOTAL AND CKMB (NOT AT ARMC)
CK TOTAL: 169 U/L (ref 7–232)
CK, MB: 2.1 ng/mL (ref 0.3–4.0)
RELATIVE INDEX: 1.2 (ref 0.0–2.5)

## 2014-05-07 LAB — HEMOGLOBIN A1C
Hgb A1c MFr Bld: 6.1 % — ABNORMAL HIGH (ref <5.7)
Mean Plasma Glucose: 128 mg/dL — ABNORMAL HIGH (ref <117)

## 2014-05-07 LAB — TSH: TSH: 1.49 u[IU]/mL (ref 0.350–4.500)

## 2014-05-07 SURGERY — LEFT HEART CATHETERIZATION WITH CORONARY ANGIOGRAM
Anesthesia: LOCAL

## 2014-05-07 MED ORDER — MIDAZOLAM HCL 2 MG/2ML IJ SOLN
INTRAMUSCULAR | Status: AC
  Filled 2014-05-07: qty 2

## 2014-05-07 MED ORDER — HEPARIN (PORCINE) IN NACL 2-0.9 UNIT/ML-% IJ SOLN
INTRAMUSCULAR | Status: AC
  Filled 2014-05-07: qty 1000

## 2014-05-07 MED ORDER — VERAPAMIL HCL 2.5 MG/ML IV SOLN
INTRAVENOUS | Status: AC
  Filled 2014-05-07: qty 2

## 2014-05-07 MED ORDER — LIDOCAINE HCL (PF) 1 % IJ SOLN
INTRAMUSCULAR | Status: AC
  Filled 2014-05-07: qty 30

## 2014-05-07 MED ORDER — ONDANSETRON HCL 4 MG/2ML IJ SOLN
4.0000 mg | Freq: Four times a day (QID) | INTRAMUSCULAR | Status: DC | PRN
Start: 2014-05-07 — End: 2014-05-08

## 2014-05-07 MED ORDER — ZOLPIDEM TARTRATE 5 MG PO TABS: 5.0000 mg | ORAL_TABLET | Freq: Every evening | ORAL | Status: DC | PRN

## 2014-05-07 MED ORDER — ALPRAZOLAM 0.25 MG PO TABS: 0.2500 mg | ORAL_TABLET | Freq: Two times a day (BID) | ORAL | Status: DC | PRN

## 2014-05-07 MED ORDER — FENTANYL CITRATE 0.05 MG/ML IJ SOLN
INTRAMUSCULAR | Status: AC
  Filled 2014-05-07: qty 2

## 2014-05-07 MED ORDER — NITROGLYCERIN 1 MG/10 ML FOR IR/CATH LAB
INTRA_ARTERIAL | Status: AC
  Filled 2014-05-07: qty 10

## 2014-05-07 MED ORDER — ACETAMINOPHEN 325 MG PO TABS: 650.0000 mg | ORAL_TABLET | ORAL | Status: DC | PRN

## 2014-05-07 NOTE — H&P (Signed)
     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 The pt reportedly had chest pain starting last night starting about 10 pm. He called his PCP this am and was sent to the ER. IN the ER he continued to have chest pain and he had an abnormal EKG with TWI but no ST elevation. Dr Katrinka BlazingSmith felt urgent cath was indicated and the pt was taken directly to the cath lab. His PMH, Allergies, and echo report are obtained from Epic records.    Problem List: Past Medical History  Diagnosis Date  . HTN (hypertension)   . 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 Sleep apnea, obstructive     C-pap  . 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 Scrotal exploration  Oct 2012    foliculitis     Allergies: No Known Allergies   Home Medications No current facility-administered medications for this encounter.  Pt in cath lab- unable to obtain home medication history   Family History  Problem Relation Age of Onset  . Diabetes Father   . Hypertension Mother      History   Social History  . Marital Status: Married    Spouse Name: N/A    Number of Children: N/A  . Years of Education: N/A   Occupational History  . Not on file.   Social History Main Topics  . Smoking status: Never Smoker   . Smokeless tobacco: Not on file  . Alcohol Use: No  . Drug Use: No  . Sexual Activity: Not on file   Other Topics Concern  . Not on file   Social History Narrative  . No narrative on file     Review of Systems: Not obtained secondary to pt factors Physical Exam: Height 5\' 10"  (1.778 m), weight 270 lb (122.471 kg).  Per Dr  Katrinka BlazingSmith    Labs:  No results found for this or any previous visit (from the past 24 hour(s)).   Radiology/Studies: No results found.  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 EKG demonstrated prominent precordial and lateral T wave inversions. A new finding compared to old EKGs was mild ST elevation in lead 3. Because of the severity of the discomfort and EKG abnormalities emergency catheterization was felt to be indicated. Patient has a history of moderate hypertrophy of the left central with normal function documented by an echo earlier this year.  The above note is accurate. The catheterization procedure was discussed with the patient in detail. Risks of stroke, death, myocardial infarction, allergy, kidney injury, among other risks were discussed and accepted by the patient on the emergency circumstances.

## 2014-05-07 NOTE — CV Procedure (Signed)
     Left Heart Catheterization with Coronary Angiography  Report  Pollie FriarMicheal T Tenpas  48 y.o.  male 09/18/1965  Procedure Date: 05/07/2014 [facility] Primary Cardiologist: Gwynneth AlbrightH W B Giovani Neumeister, III, Wilhelmenia BlaseM. D. Primary Physician: Blair Heysobert Ehinger, M.D.  INDICATIONS: Prolonged chest pain with abnormal EKG. Pain is ongoing and EKG had repolarization abnormality that suggest potential ischemia versus cardiomyopathy. Emergency catheterization is undertaken to exclude the possibility of acute occlusion of an epicardial vessel.  PROCEDURE: 1. Left heart cath; 2. Coronary angiography to my: 3. Left ventriculography  CONSENT:  The risks, benefits, and details of the procedure were explained in detail to the patient. Risks including death, stroke, heart attack, kidney injury, allergy, limb ischemia, bleeding and radiation injury were discussed.  The patient verbalized understanding and wanted to proceed.  Informed written consent was obtained.  PROCEDURE TECHNIQUE:  After Xylocaine anesthesia a a 5 French Slender sheath was placed in the right radial artery with an angiocath and the modified Seldinger technique.  Coronary angiography was done using a 5 F JR 4 and JL 3.5 cm diagnostic catheter. We also used an 3.5 cm EBU 5 [place] guide catheter to enhance contrast delivery.  Left ventriculography was done using the JR 4 diagnostic catheter and hand injection.   Image review did not demonstrate evidence of total occlusion.  The case was terminated and hemostasis achieved with the wrist band   CONTRAST:  Total of 105 cc.  COMPLICATIONS:  none   HEMODYNAMICS:  Aortic pressure 143/81 mmHg; LV pressure 156/0 mmHg; LVEDP 27 mmHg  ANGIOGRAPHIC DATA:   The left main coronary artery is normal and widely patent.  The left anterior descending artery is widely patent and tortuous. It wraps around the left ventricular apex.  The ramus intermedius is a large trifurcating vessel that  is free of obstruction  The left circumflex artery is gives origin to 3 obtuse marginal branches that are relatively small and all widely patent..  The right coronary artery is nondominant and normal.  LEFT VENTRICULOGRAM:  Left ventricular angiogram was done in the 30 RAO projection and revealed a spade-shaped LV cavity. EF is 70%.   IMPRESSIONS:  1. Widely patent coronary arteries without evidence of significant coronary atherosclerosis. 2. Apical hypertrophic cardiomyopathy 3. LVEF 70% with elevated end-diastolic pressure compatible with diastolic heart failure   RECOMMENDATION:  Optimize therapy for hypertrophic cardiomyopathy. Cycle cardiac markers Consider other sources of chest discomfort if markers are negative..Marland Kitchen

## 2014-05-08 ENCOUNTER — Encounter (HOSPITAL_COMMUNITY): Payer: Self-pay | Admitting: Physician Assistant

## 2014-05-08 ENCOUNTER — Telehealth: Payer: Self-pay | Admitting: Interventional Cardiology

## 2014-05-08 ENCOUNTER — Other Ambulatory Visit: Payer: Self-pay | Admitting: Physician Assistant

## 2014-05-08 DIAGNOSIS — Z9989 Dependence on other enabling machines and devices: Secondary | ICD-10-CM | POA: Diagnosis present

## 2014-05-08 DIAGNOSIS — G4733 Obstructive sleep apnea (adult) (pediatric): Secondary | ICD-10-CM

## 2014-05-08 DIAGNOSIS — I422 Other hypertrophic cardiomyopathy: Secondary | ICD-10-CM

## 2014-05-08 DIAGNOSIS — K219 Gastro-esophageal reflux disease without esophagitis: Secondary | ICD-10-CM | POA: Diagnosis present

## 2014-05-08 DIAGNOSIS — I10 Essential (primary) hypertension: Secondary | ICD-10-CM

## 2014-05-08 DIAGNOSIS — I421 Obstructive hypertrophic cardiomyopathy: Secondary | ICD-10-CM

## 2014-05-08 DIAGNOSIS — I200 Unstable angina: Secondary | ICD-10-CM | POA: Insufficient documentation

## 2014-05-08 LAB — BASIC METABOLIC PANEL
Anion gap: 12 (ref 5–15)
BUN: 10 mg/dL (ref 6–23)
CO2: 28 meq/L (ref 19–32)
CREATININE: 0.98 mg/dL (ref 0.50–1.35)
Calcium: 9.1 mg/dL (ref 8.4–10.5)
Chloride: 101 mEq/L (ref 96–112)
GFR calc non Af Amer: >90 mL/min (ref >90)
GLUCOSE: 96 mg/dL (ref 70–99)
Potassium: 4.3 mEq/L (ref 3.7–5.3)
Sodium: 141 mEq/L (ref 137–147)

## 2014-05-08 LAB — TROPONIN I: Troponin I: <0.3 ng/mL (ref <0.30)

## 2014-05-08 NOTE — Plan of Care (Signed)
Problem: Consults Goal: Chest Pain Patient Education (See Patient Education module for education specifics.) Outcome: Completed/Met Date Met:  05/08/14

## 2014-05-08 NOTE — Discharge Instructions (Signed)

## 2014-05-08 NOTE — Discharge Summary (Signed)
Discharge Summary   Patient ID: Kyle Cross MRN: [id number], DOB/AGE: [birth year] 48 y.o. Admit date: 05/07/2014 D/C date:     05/08/2014  Primary Cardiologist: Dr. Katrinka BlazingSmith (new)  Principal Problem:   Hypertrophic cardiomyopathy Active Problems:   Benign essential HTN   Neuropathy-2012- felt to be infectious (viral)   GERD (gastroesophageal reflux disease)   OSA on CPAP   Admission Dates: 05/07/14- 05/08/14 Discharge Diagnosis: chest pain s/p LHC with normal coronaries and evidence of HOCM  HPI: Kyle Cross is a 48 y.o. 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   Hospital Course  Chest pain- s/p urgent LHC on 04/06/14 which revealed  1. Widely patent coronary arteries without evidence of significant coronary atherosclerosis. 2. Apical hypertrophic cardiomyopathy 3. LVEF 70% with elevated end-diastolic pressure compatible with diastolic heart failure -- Cardiac markers have remained negative.   Hypertrophic cardiomyopathy- 2d ECHO 09/10/2013 with EF 60-65%. Mod conc hypertrophy. No RWMA. G1DD. E/e' ratio is >10,suggesting elevated LV filling pressure. Severe LA dilation.  -- The office will call him to make an appointment for a cardiac MR with morphology only for further evaluation of HOCM. The order has been placed in EPIC -- He takes diltiazem CD 120mg  at home for "enlarged heart". Will continue this for possible HOCM  HLD- TC 160. TG 88. HDL 48. LDL 94.   The patient has had an uncomplicated hospital course and is recovering well. The radial catheter site is stable. He has been seen by Dr. Katrinka BlazingSmith today and deemed ready for discharge home. All follow-up appointments have been scheduled. The office will call him to make an  appointment for a cardiac MR with morphology only. A work excuse note was provided as well. Discharge medications are listed below.   Discharge Vitals: Blood pressure 133/78, pulse 72, temperature 98 F (36.7 C), temperature source Oral, resp. rate 20, height 5\' 10"  (1.778 m), weight 266 lb 8.6 oz (120.9 kg), SpO2 97 %.  Labs: Lab Results  Component Value Date   WBC 5.4 05/07/2014   HGB 15.0 05/07/2014   HCT 44.0 05/07/2014   MCV 80.1 05/07/2014   PLT 261 05/07/2014     Recent Labs Lab 05/07/14 1507 05/07/14 1514  NA 140 140  K 3.7 3.5*  CL 101 102  CO2 24  --   BUN 11 10  CREATININE 0.84 0.80  CALCIUM 9.6  --   PROT 7.0  --   BILITOT 0.3  --   ALKPHOS 65  --   ALT 21  --   AST 19  --   GLUCOSE 101* 106*    Recent Labs  05/07/14 1507 05/07/14 2023 05/08/14 0130  CKTOTAL 169  --   --   CKMB 2.1  --   --   TROPONINI <0.30 <0.30 <0.30   Lab Results  Component Value Date   CHOL 160 05/07/2014   HDL 48 05/07/2014   LDLCALC 94 05/07/2014   TRIG 88 05/07/2014     Diagnostic Studies/Procedures      Left Heart Catheterization with Coronary Angiography Report  Kyle Cross  48 y.o.  male [birth year] Procedure Date: 05/07/2014 [facility] Primary Cardiologist: Lenward ChancellorH W B Smith, III, M. D. Primary Physician: Blair Heysobert Ehinger,  M.D. INDICATIONS: Prolonged chest pain with abnormal EKG. Pain is ongoing and EKG had repolarization abnormality that suggest potential ischemia versus cardiomyopathy. Emergency catheterization is undertaken to exclude the possibility of acute occlusion of an epicardial vessel. PROCEDURE: 1. Left heart cath; 2. Coronary angiography to my: 3. Left ventriculography CONSENT:  The risks, benefits, and details of the procedure were explained in detail to the patient. Risks including death, stroke, heart attack, kidney injury, allergy, limb ischemia, bleeding and radiation injury were discussed. The  patient verbalized understanding and wanted to proceed. Informed written consent was obtained. PROCEDURE TECHNIQUE: After Xylocaine anesthesia a a 5 French Slender sheath was placed in the right radial artery with an angiocath and the modified Seldinger technique. Coronary angiography was done using a 5 F JR 4 and JL 3.5 cm diagnostic catheter. We also used an 3.5 cm EBU 5 [place] guide catheter to enhance contrast delivery. Left ventriculography was done using the JR 4 diagnostic catheter and hand injection.  Image review did not demonstrate evidence of total occlusion. The case was terminated and hemostasis achieved with the wrist band CONTRAST: Total of 105 cc. COMPLICATIONS: none  HEMODYNAMICS: Aortic pressure 143/81 mmHg; LV pressure 156/0 mmHg; LVEDP 27 mmHg ANGIOGRAPHIC DATA: The left main coronary artery is normal and widely patent. The left anterior descending artery is widely patent and tortuous. It wraps around the left ventricular apex. The ramus intermedius is a large trifurcating vessel that is free of obstruction The left circumflex artery is gives origin to 3 obtuse marginal branches that are relatively small and all widely patent.. The right coronary artery is nondominant and normal. LEFT VENTRICULOGRAM: Left ventricular angiogram was done in the 30 RAO projection and revealed a spade-shaped LV cavity. EF is 70%. IMPRESSIONS: 1. Widely patent coronary arteries without evidence of significant coronary atherosclerosis. 2. Apical hypertrophic cardiomyopathy 3. LVEF 70% with elevated end-diastolic pressure compatible with diastolic heart failure RECOMMENDATION: Optimize therapy for hypertrophic cardiomyopathy. Cycle cardiac markers Consider other sources of chest discomfort if markers are negative.    2d ECHO Study Date: 09/10/2013 LV EF: 60% - 65% Study Conclusions - Left ventricle: The cavity size was normal. There was moderate concentric hypertrophy.  Systolic function was normal. The estimated ejection fraction was in the range of 60% to 65%. Wall motion was normal; there were no regional wall motion abnormalities. Doppler parameters are consistent with abnormal left ventricular relaxation (grade 1 diastolic dysfunction). The E/e' ratio is >10, suggesting elevated LV filling pressure. - Left atrium: Severely dilated (>40 ml/m2), - Inferior vena cava: The vessel was normal in size; the respirophasic diameter changes were in the normal range (= 50%); findings are consistent with normal central venous pressure. - Pericardium, extracardiac: There was no pericardial effusion.  Discharge Medications     Medication List    TAKE these medications        diltiazem 120 MG 24 hr capsule  Commonly known as:  CARDIZEM CD  Take 120 mg by mouth daily.     loratadine-pseudoephedrine 5-120 MG per tablet  Commonly known as:  CLARITIN-D 12-hour  Take 1 tablet by mouth 2 (two) times daily.     NASACORT ALLERGY 24HR 55 MCG/ACT Aero nasal inhaler  Generic drug:  triamcinolone  Place 2 sprays into the nose daily.     oxymetazoline 0.05 % nasal spray  Commonly known as:  AFRIN  Place 1 spray into both nostrils every evening.        Disposition   The patient will  be discharged in stable condition to home.  Follow-up Information    Follow up with Abelino Derrick, PA-C On 05/14/2014.   Specialty:  Cardiology   Why:  @ 9:30 am   Contact information:   [address] [place] [address] [phone]         Duration of Discharge Encounter: Greater than 30 minutes including physician and PA time.  Byrd Hesselbach R PA-C 05/08/2014, 9:26 AM

## 2014-05-08 NOTE — Progress Notes (Signed)
UR completed Schuyler Olden K. Maysel Mccolm, RN, BSN, MSHL, CCM  05/08/2014 11:08 AM

## 2014-05-08 NOTE — Progress Notes (Signed)
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 No SOB. Otherwise feeling well.   CURRENT MEDS    OBJECTIVE  Filed Vitals:   05/07/14 2055 05/07/14 2218 05/08/14 0001 05/08/14 0607  BP: 132/73  141/81 140/82  Pulse: 67 68 63 75  Temp: 98.5 F (36.9 C)  97.6 F (36.4 C) 98.5 F (36.9 C)  TempSrc: Oral  Axillary Oral  Resp: 15 18 18 20   Height:      Weight:   266 lb 8.6 oz (120.9 kg)   SpO2: 97% 97% 98% 95%    Intake/Output Summary (Last 24 hours) at 05/08/14 0723 Last data filed at 05/08/14 0616  Gross per 24 hour  Intake    320 ml  Output   1025 ml  Net   -705 ml   Filed Weights   05/07/14 1300 05/08/14 0001  Weight: 270 lb (122.471 kg) 266 lb 8.6 oz (120.9 kg)    PHYSICAL EXAM  General: Pleasant, NAD. Neuro: Alert and oriented X 3. Moves all extremities spontaneously. Psych: Normal affect. HEENT:  Normal  Neck: Supple without bruits or JVD. Lungs:  Resp regular and unlabored, CTA. Heart: RRR no s3, s4, or murmurs. Abdomen: Soft, non-tender, non-distended, BS + x 4.  Extremities: No clubbing, cyanosis or edema. DP/PT/Radials 2+ and equal bilaterally.  Accessory Clinical Findings  CBC  Recent Labs  05/07/14 1507 05/07/14 1514  WBC 5.4  --   HGB 13.7 15.0  HCT 39.9 44.0  MCV 80.1  --   PLT 261  --    Basic Metabolic Panel  Recent Labs  05/07/14 1507 05/07/14 1514  NA 140 140  K 3.7 3.5*  CL 101 102  CO2 24  --   GLUCOSE 101* 106*  BUN 11 10  CREATININE 0.84 0.80  CALCIUM 9.6  --    Liver Function Tests  Recent Labs  05/07/14 1507  AST 19  ALT 21  ALKPHOS 65  BILITOT 0.3  PROT 7.0  ALBUMIN 3.9    Cardiac Enzymes  Recent  Labs  05/07/14 1507 05/07/14 2023 05/08/14 0130  CKTOTAL 169  --   --   CKMB 2.1  --   --   TROPONINI <0.30 <0.30 <0.30   Hemoglobin A1C  Recent Labs  05/07/14 1507  HGBA1C 6.1*   Fasting Lipid Panel  Recent Labs  05/07/14 1507  CHOL 160  HDL 48  LDLCALC 94  TRIG 88  CHOLHDL 3.3   Thyroid Function Tests  Recent Labs  05/07/14 2023  TSH 1.490    TELE  NSR  Radiology/Studies     Left Heart Catheterization with Coronary Angiography Report  SHELL YANDOW  48 y.o.  male 1965/10/23 Procedure Date: 05/07/2014 Referring Physician: [facility] Primary Cardiologist: Gwynneth Albright, Wilhelmenia Blase Primary Physician: Blair Heys, M.D. INDICATIONS: Prolonged chest pain with abnormal EKG. Pain is ongoing and EKG had repolarization abnormality that suggest potential ischemia versus cardiomyopathy. Emergency catheterization is undertaken to exclude the possibility of acute occlusion of an epicardial vessel. PROCEDURE: 1. Left heart cath; 2. Coronary angiography to my: 3. Left ventriculography CONSENT:  The risks, benefits, and details  of the procedure were explained in detail to the patient. Risks including death, stroke, heart attack, kidney injury, allergy, limb ischemia, bleeding and radiation injury were discussed. The patient verbalized understanding and wanted to proceed. Informed written consent was obtained. PROCEDURE TECHNIQUE: After Xylocaine anesthesia a a 5 French Slender sheath was placed in the right radial artery with an angiocath and the modified Seldinger technique. Coronary angiography was done using a 5 F JR 4 and JL 3.5 cm diagnostic catheter. We also used an 3.5 cm EBU 5 [place] guide catheter to enhance contrast delivery. Left ventriculography was done using the JR 4 diagnostic catheter and hand injection.  Image review did not demonstrate evidence of total occlusion. The case was terminated and hemostasis achieved with the  wrist band CONTRAST: Total of 105 cc. COMPLICATIONS: none  HEMODYNAMICS: Aortic pressure 143/81 mmHg; LV pressure 156/0 mmHg; LVEDP 27 mmHg ANGIOGRAPHIC DATA: The left main coronary artery is normal and widely patent. The left anterior descending artery is widely patent and tortuous. It wraps around the left ventricular apex. The ramus intermedius is a large trifurcating vessel that is free of obstruction The left circumflex artery is gives origin to 3 obtuse marginal branches that are relatively small and all widely patent.. The right coronary artery is nondominant and normal. LEFT VENTRICULOGRAM: Left ventricular angiogram was done in the 30 RAO projection and revealed a spade-shaped LV cavity. EF is 70%. IMPRESSIONS: 1. Widely patent coronary arteries without evidence of significant coronary atherosclerosis. 2. Apical hypertrophic cardiomyopathy 3. LVEF 70% with elevated end-diastolic pressure compatible with diastolic heart failure RECOMMENDATION: Optimize therapy for hypertrophic cardiomyopathy. Cycle cardiac markers Consider other sources of chest discomfort if markers are negative.   2d ECHO Study Date: 09/10/2013 LV EF: 60% - 65% Study Conclusions - Left ventricle: The cavity size was normal. There was moderate concentric hypertrophy. Systolic function was normal. The estimated ejection fraction was in the range of 60% to 65%. Wall motion was normal; there were no regional wall motion abnormalities. Doppler parameters are consistent with abnormal left ventricular relaxation (grade 1 diastolic dysfunction). The E/e' ratio is >10, suggesting elevated LV filling pressure. - Left atrium: Severely dilated (>40 ml/m2), - Inferior vena cava: The vessel was normal in size; the respirophasic diameter changes were in the normal range (= 50%); findings are consistent with normal central venous pressure. - Pericardium, extracardiac: There was no  pericardial effusion.   ASSESSMENT AND PLAN  Kyle Cross is a 48 y.o. male with a history of HTN, OSA on CPAP and HTN cardiovascular disease (LVH and LAE on echo), but no prior documented CAD history who [facility]14 with chest pain and ECG changes and was taken back for urgent cardiac catheterization.  Chest pain- s/p urgent LHC on 04/06/14 which revealed   1. Widely patent coronary arteries without evidence of significant coronary atherosclerosis.  2. Apical hypertrophic cardiomyopathy  3. LVEF 70% with elevated end-diastolic pressure compatible with diastolic heart failure -- Cardiac markers have remained negative. Consider other sources of chest discomfort if markers are negative. MD to see.   Hypertrophic cardiomyopathy- 2d ECHO 09/10/2013 with EF 60-65%.  Mod conc hypertrophy. No RWMA. G1DD. E/e' ratio is >10,suggesting elevated LV filling pressure. Severe LA dilation.   -- Consider repeat ECHO with chest pain.  -- He takes diltiazem CD 120mg  at home for "enlarged heart"   HLD- TC 160. TG 88. HDL 48. LDL 94.     Signed, Cline CrockHOMPSON, KATHRYN R PA-C  Pager [phone]

## 2014-05-08 NOTE — Plan of Care (Signed)
Problem: Phase III Progression Outcomes Goal: Tolerating diet Outcome: Completed/Met Date Met:  05/08/14

## 2014-05-08 NOTE — Plan of Care (Signed)
Problem: Phase III Progression Outcomes Goal: Vascular site scale level 0 - I Vascular Site Scale Level 0: No bruising/bleeding/hematoma Level I (Mild): Bruising/Ecchymosis, minimal bleeding/ooozing, palpable hematoma < 3 cm Level II (Moderate): Bleeding not affecting hemodynamic parameters, pseudoaneurysm, palpable hematoma > 3 cm Level III (Severe) Bleeding which affects hemodynamic parameters or retroperitoneal hemorrhage  Outcome: Completed/Met Date Met:  05/08/14

## 2014-05-08 NOTE — Telephone Encounter (Signed)
FOLLOW UP    CATHRINE called TCM  Appt made. 11/17

## 2014-05-08 NOTE — Plan of Care (Signed)
Problem: Phase III Progression Outcomes Goal: Hemodynamically stable Outcome: Completed/Met Date Met:  05/08/14

## 2014-05-08 NOTE — Care Management Note (Signed)
    Page 1 of 1   05/08/2014     11:08:22 AM CARE MANAGEMENT NOTE 05/08/2014  Patient:  Kyle Cross,Kyle Cross   Account Number:  [id number]  Date Initiated:  05/08/2014  Documentation initiated by:  Kyle Cross,Kyle Cross  Subjective/Objective Assessment:   CP     Action/Plan:   CM to follow for disposition needs   Anticipated DC Date:  05/08/2014   Anticipated DC Plan:  HOME/SELF CARE         Choice offered to / List presented to:             Status of service:  Completed, signed off Medicare Important Message given?  NO (If response is "NO", the following Medicare IM given date fields will be blank) Date Medicare IM given:   Medicare IM given by:   Date Additional Medicare IM given:   Additional Medicare IM given by:    Discharge Disposition:  HOME/SELF CARE  Per UR Regulation:  Reviewed for med. necessity/level of care/duration of stay  If discussed at Long Length of Stay Meetings, dates discussed:    Comments:  [name] RN, BSN, MSHL, CCM  Nurse - Case Manager,  (Unit [phone])  [phone]  05/08/2014 Left Heart Cath Specialty Med Review:  No needs identified Dispo:  Home / Self care.

## 2014-05-09 ENCOUNTER — Encounter: Payer: Self-pay | Admitting: Cardiology

## 2014-05-09 NOTE — Telephone Encounter (Signed)
I left a message for the patient to call. 

## 2014-05-09 NOTE — Telephone Encounter (Signed)
Patient contacted regarding discharge from [facility] on 05/06/2014.  Patient understands to follow up with provider Corine ShelterLuke Kilroy on 05/14/2014 at [facility] office { Patient understands discharge instructions?  yes Patient understands medications and regiment?  Yes, however his discharge medications include Claritin D/120mg . i advised him that Pseudophedrine will increase heart rate and blood pressure, and he probably should not take it all the time. Told him to discuss this with Corine ShelterLuke Kilroy Patient understands to bring all medications to this visit?  yes

## 2014-05-14 ENCOUNTER — Ambulatory Visit (INDEPENDENT_AMBULATORY_CARE_PROVIDER_SITE_OTHER): Payer: BC Managed Care – PPO | Admitting: Cardiology

## 2014-05-14 ENCOUNTER — Encounter: Payer: Self-pay | Admitting: Cardiology

## 2014-05-14 VITALS — BP 127/88 | HR 61 | Ht 70.0 in | Wt 265.3 lb

## 2014-05-14 DIAGNOSIS — I200 Unstable angina: Secondary | ICD-10-CM

## 2014-05-14 DIAGNOSIS — I119 Hypertensive heart disease without heart failure: Secondary | ICD-10-CM

## 2014-05-14 DIAGNOSIS — G4733 Obstructive sleep apnea (adult) (pediatric): Secondary | ICD-10-CM

## 2014-05-14 DIAGNOSIS — Z0389 Encounter for observation for other suspected diseases and conditions ruled out: Secondary | ICD-10-CM

## 2014-05-14 DIAGNOSIS — I422 Other hypertrophic cardiomyopathy: Secondary | ICD-10-CM

## 2014-05-14 DIAGNOSIS — IMO0001 Reserved for inherently not codable concepts without codable children: Secondary | ICD-10-CM

## 2014-05-14 DIAGNOSIS — Z9989 Dependence on other enabling machines and devices: Secondary | ICD-10-CM

## 2014-05-14 DIAGNOSIS — E669 Obesity, unspecified: Secondary | ICD-10-CM

## 2014-05-14 MED ORDER — LORATADINE 10 MG PO TABS: 10.0000 mg | ORAL_TABLET | Freq: Every day | ORAL | Status: DC | PRN

## 2014-05-14 NOTE — Patient Instructions (Signed)
FOLLOW UP IN 3 MONTHS WITH DR. Katrinka BlazingSMITH  STOP TAKING CLARITIN D ONLY TAKE PLAIN CLARITIN AS NEEDED  PER LUKE KILROY, PA RECOMMENDS YOU TO START WEANING OFF THE Kyle EpleyAFRIN

## 2014-05-14 NOTE — Assessment & Plan Note (Signed)
05/07/14 

## 2014-05-14 NOTE — Progress Notes (Signed)
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 He has a history of HTN and OSA. He presented to the ER 05/07/14 with chest pain worrisome for [place] and had a markedly abnormal EKG. He was taken urgently to the cath lab by Dr Katrinka BlazingSmith. Cath revealed normal coronaries with apical hypertrophy. An echo showed diastolic dysfunction, severely dilated LA, and moderate LVH. He is in the office today for follow up.            Since discharge he has done well from a cardiac standpoint. He still has some vague chest discomfort, "1-2".  His main complaints are chronic sinus congestion and daytime fatigue despite C-pap.    Current Outpatient Prescriptions  Medication Sig Dispense Refill  . diltiazem (CARDIZEM CD) 120 MG 24 hr capsule Take 120 mg by mouth daily.  3  . oxymetazoline (AFRIN) 0.05 % nasal spray Place 1 spray into both nostrils every evening.    . triamcinolone (NASACORT ALLERGY 24HR) 55 MCG/ACT AERO nasal inhaler Place 2 sprays into the nose daily.    Marland Kitchen. loratadine-pseudoephedrine (CLARITIN-D 12-HOUR) 5-120 MG per tablet Take 1 tablet by mouth 2 (two) times daily.     No current facility-administered medications for this visit.    Allergies  Allergen Reactions  . Shrimp [Shellfish Allergy] Anaphylaxis    History   Social History  . Marital Status: Married    Spouse Name: N/A    Number of Children: N/A  . Years of Education: N/A   Occupational History  . Not on file.   Social History Main Topics  . Smoking status: Never Smoker   . Smokeless tobacco: Never Used  . Alcohol Use: 0.6 oz/week    0 Not specified, 1 Glasses of wine per week  . Drug Use: No  . Sexual Activity: Yes   Other Topics Concern  . Not on file   Social History Narrative     Review of Systems: He has had previous surgery for congestion but apparently this was an  incomplete operation. He fall asleep at work. He is on multiple decongestants including Afrin and Claritin D.  General: negative for chills, fever, night sweats or weight changes.  Cardiovascular: negative for chest pain, dyspnea on exertion, edema, orthopnea, palpitations, paroxysmal nocturnal dyspnea or shortness of breath Dermatological: negative for rash Respiratory: negative for cough or wheezing Urologic: negative for hematuria Abdominal: negative for nausea, vomiting, diarrhea, bright red blood per rectum, melena, or hematemesis Neurologic: negative for visual changes, syncope, or dizziness All other systems reviewed and are otherwise negative except as noted above.    Blood pressure 127/88, pulse 61, height 5\' 10"  (1.778 m), weight 265 lb 4.8 oz (120.339 kg).  General appearance: alert, cooperative, no distress and moderately obese Neck: no carotid bruit and no JVD Lungs: clear to auscultation bilaterally Heart: regular rate and rhythm Extremities: Rt wrist without hematoma  EKG NST diffuse TWI  ASSESSMENT AND PLAN:   Unstable angina Admitted 05/07/14 with chest pain and abnormal EKG- urgent cath  Normal coronary arteries 05/07/14  OSA on CPAP Chronic problem, he has had prior surgery, he is on C-pap  HTN cardiovascular disease-LVH, LAE Nov 2015 B/P controlled  Obesity-BMI 38 .   PLAN  I suggested he take regular Claritin as opposed to Claritin D. I also suggested he try and wean himself off  Afrin. He is for cardiac MRI to r/o an infiltrative cardiomyopathy (he does get keloids).   Jaken Fregia KPA-C 05/14/2014 10:19 AM

## 2014-05-14 NOTE — Assessment & Plan Note (Signed)
Admitted 05/07/14 with chest pain and abnormal EKG- urgent cath

## 2014-05-14 NOTE — Assessment & Plan Note (Signed)
Chronic problem, he has had prior surgery, he is on C-pap

## 2014-05-14 NOTE — Assessment & Plan Note (Signed)
B/P controlled 

## 2014-05-15 ENCOUNTER — Ambulatory Visit (HOSPITAL_COMMUNITY)
Admission: RE | Admit: 2014-05-15 | Discharge: 2014-05-15 | Disposition: A | Discharge status: Home / Self Care | Payer: BC Managed Care – PPO | Source: Ambulatory Visit | Attending: Physician Assistant | Admitting: Physician Assistant

## 2014-05-15 DIAGNOSIS — I421 Obstructive hypertrophic cardiomyopathy: Secondary | ICD-10-CM

## 2014-06-06 ENCOUNTER — Encounter (HOSPITAL_COMMUNITY): Payer: Self-pay | Admitting: Interventional Cardiology

## 2014-08-27 ENCOUNTER — Encounter: Payer: Self-pay | Admitting: Interventional Cardiology

## 2014-08-27 ENCOUNTER — Ambulatory Visit (INDEPENDENT_AMBULATORY_CARE_PROVIDER_SITE_OTHER): Payer: BLUE CROSS/BLUE SHIELD | Admitting: Interventional Cardiology

## 2014-08-27 VITALS — BP 144/90 | HR 68 | Ht 70.0 in | Wt 275.0 lb

## 2014-08-27 DIAGNOSIS — I5032 Chronic diastolic (congestive) heart failure: Secondary | ICD-10-CM

## 2014-08-27 DIAGNOSIS — I422 Other hypertrophic cardiomyopathy: Secondary | ICD-10-CM

## 2014-08-27 DIAGNOSIS — I10 Essential (primary) hypertension: Secondary | ICD-10-CM

## 2014-08-27 MED ORDER — DILTIAZEM HCL ER COATED BEADS 240 MG PO CP24: 240.0000 mg | ORAL_CAPSULE | Freq: Every day | ORAL | Status: DC

## 2014-08-27 NOTE — Progress Notes (Signed)
Cardiology Office Note   Date:  08/27/2014   ID:  Kyle Cross, DOB 15-Aug-1965, MRN [id number]  PCP:  Thora Lance, MD  Cardiologist:   Lesleigh Noe, MD   No chief complaint on file.     History of Present Illness: Kyle Cross is a 49 y.o. male who presents for hypertrophic cardiomyopathy documented by both Kyle and heart catheterization. Also documented to have elevated left ventricular end-diastolic pressure. He denies peripheral edema and orthopnea. There is mild discomfort with exertion.    Past Medical History  Diagnosis Date  . HTN (hypertension)   . Hypertensive cardiovascular disease     a. LVH, LAE noted on ECG  . Neuropathy 2012    felt to be infectious (viral)  . OSA on CPAP   . GERD (gastroesophageal reflux disease)   . Hypertrophic cardiomyopathy     a. noted on LHC on 04/06/14.     Past Surgical History  Procedure Laterality Date  . Scrotal exploration  Oct 2012    foliculitis  . Cardiac catheterization  05/07/2014  . Vasectomy  02/1998  . Left heart catheterization with coronary angiogram N/A 05/07/2014    Procedure: LEFT HEART CATHETERIZATION WITH CORONARY ANGIOGRAM;  Surgeon: Lesleigh Noe, MD;  Location: [facility] CATH LAB;  Service: Cardiovascular;  Laterality: N/A;     Current Outpatient Prescriptions  Medication Sig Dispense Refill  . diltiazem (CARDIZEM CD) 120 MG 24 hr capsule Take 120 mg by mouth daily.  3  . loratadine (CLARITIN) 10 MG tablet Take 1 tablet (10 mg total) by mouth daily as needed for allergies.    Marland Kitchen oxymetazoline (AFRIN) 0.05 % nasal spray Place 1 spray into both nostrils every evening.    . triamcinolone (NASACORT ALLERGY 24HR) 55 MCG/ACT AERO nasal inhaler Place 2 sprays into the nose daily.     No current facility-administered medications for this visit.    Allergies:   Shrimp    Social History:  The patient  reports that he has never smoked. He has never used smokeless tobacco. He reports that he drinks  about 0.6 oz of alcohol per week. He reports that he does not use illicit drugs.   Family History:  The patient's family history includes Diabetes in his father; Hypertension in his mother.    ROS:  Please see the history of present illness.   Otherwise, review of systems are positive for difficulty sleeping. Sleep apnea therapy is not as effective as it should be because of mechanical turbinate and sinus problems..   All other systems are reviewed and negative.    PHYSICAL EXAM: VS:  BP 144/90 mmHg  Pulse 68  Ht  (1.778 m)  Wt 275 lb (124.739 kg)  BMI 39.46 kg/m2  SpO2 98% , BMI Body mass index is 39.46 kg/(m^2). GEN: Well nourished, well developed, in no acute distress HEENT: normal Neck: no JVD, carotid bruits, or masses Cardiac: RRR; no murmurs, rubs, or gallops,no edema  Respiratory:  clear to auscultation bilaterally, normal work of breathing GI: soft, nontender, nondistended, + BS MS: no deformity or atrophy Skin: warm and dry, no rash Neuro:  Strength and sensation are intact Psych: euthymic mood, full affect   EKG:  EKG is not ordered today.    Recent Labs: 05/07/2014: ALT 21; Hemoglobin 15.0; Platelets 261; TSH 1.490 05/08/2014: BUN 10; Creatinine 0.98; Potassium 4.3; Sodium 141    Lipid Panel    Component Value Date/Time   CHOL 160  05/07/2014 1507   TRIG 88 05/07/2014 1507   HDL 48 05/07/2014 1507   CHOLHDL 3.3 05/07/2014 1507   VLDL 18 05/07/2014 1507   LDLCALC 94 05/07/2014 1507      Wt Readings from Last 3 Encounters:  08/27/14 275 lb (124.739 kg)  05/14/14 265 lb 4.8 oz (120.339 kg)  05/08/14 266 lb 8.6 oz (120.9 kg)      Other studies Reviewed: Additional studies/ records that were reviewed today include.   ASSESSMENT AND PLAN:  Hypertrophic cardiomyopathy: The patient still has intermittent chest discomfort, dyspnea on exertion, and exertional fatigue.  Benign essential HTN: Poorly controlled systolic.  Chronic diastolic heart  failure related to hypertrophic cardiomyopathy     Current medicines are reviewed at length with the patient today.  The patient does not have concerns regarding medicines.  The following changes have been made:  Increase diltiazem CD to 240 mg per day. I hope this will improve the chest discomfort and blood pressure simultaneously. Also recommended aerobic activity. Weight loss is also essential. We discussed this.  Labs/ tests ordered today include:  No orders of the defined types were placed in this encounter.     Disposition:   FU with Mendel RyderH. Smith in 6 months   Signed, Lesleigh NoeSMITH III,HENRY W, MD  08/27/2014 3:50 PM    [facility] [address], [place], [place]  [address] Phone: [phone]; Fax: [phone]

## 2014-08-27 NOTE — Patient Instructions (Addendum)
Your physician has recommended you make the following change in your medication:  1) INCREASE Cardizem to 240mg  daily. An Rx has been sent to your pharmacy  Your physician encouraged you to lose weight for better health.  Your physician discussed the importance of regular exercise and recommended that you start or continue a regular exercise program for good health.  Your physician wants you to follow-up in: 6 months with Dr.Smith You will receive a reminder letter in the mail two months in advance. If you don't receive a letter, please call our office to schedule the follow-up appointment.

## 2015-08-27 ENCOUNTER — Other Ambulatory Visit (HOSPITAL_COMMUNITY): Payer: Self-pay | Admitting: Family Medicine

## 2015-08-27 DIAGNOSIS — I517 Cardiomegaly: Secondary | ICD-10-CM

## 2015-09-05 ENCOUNTER — Other Ambulatory Visit: Payer: Self-pay | Admitting: Interventional Cardiology

## 2015-09-08 ENCOUNTER — Other Ambulatory Visit (HOSPITAL_COMMUNITY): Payer: BLUE CROSS/BLUE SHIELD

## 2015-12-02 ENCOUNTER — Telehealth: Payer: Self-pay | Admitting: Interventional Cardiology

## 2015-12-02 NOTE — Telephone Encounter (Signed)
New Message  Pt c/o of Chest Pain: 1. Are you having CP right now? No  2. Are you experiencing any other symptoms (ex. SOB, nausea, vomiting, sweating)? No, not currently  3. How long have you been experiencing CP? 1 week  4. Is your CP continuous or coming and going? Coming and going off and on but persistently,  5. Have you taken Nitroglycerin? No, he doesn't have Nitro's. But he is taking his medications diltiazem (CARDIZEM CD) 240 MG 24 hr capsule   Comments:  Wife called this message in for the pt. She states that he notices the chest pain more with exertion.

## 2015-12-02 NOTE — Progress Notes (Signed)
  ROS  This encounter was created in error - please disregard. 

## 2015-12-02 NOTE — Telephone Encounter (Signed)
Pt C/O of SOB and chest pain with mild exertion for 2 month. Pt states that the chest pain sometimes wakes him up when he is sleep. The pain is similar like he had before. An appointment was made with Bary CastillaKaty thompson PA for tomorrow 6/7 at 8:30 AM pt is aware. Pt is aware that can go to the ER if the pain gest worse. Pt verbalized understanding.

## 2015-12-03 ENCOUNTER — Encounter: Payer: 59 | Admitting: Physician Assistant

## 2015-12-03 ENCOUNTER — Ambulatory Visit (INDEPENDENT_AMBULATORY_CARE_PROVIDER_SITE_OTHER): Payer: 59 | Admitting: Physician Assistant

## 2015-12-03 ENCOUNTER — Encounter: Payer: Self-pay | Admitting: Physician Assistant

## 2015-12-03 VITALS — BP 120/82 | HR 74 | Ht 70.0 in | Wt 274.2 lb

## 2015-12-03 DIAGNOSIS — I422 Other hypertrophic cardiomyopathy: Secondary | ICD-10-CM | POA: Diagnosis not present

## 2015-12-03 DIAGNOSIS — I5032 Chronic diastolic (congestive) heart failure: Secondary | ICD-10-CM

## 2015-12-03 DIAGNOSIS — E669 Obesity, unspecified: Secondary | ICD-10-CM | POA: Diagnosis not present

## 2015-12-03 DIAGNOSIS — I119 Hypertensive heart disease without heart failure: Secondary | ICD-10-CM

## 2015-12-03 DIAGNOSIS — G4733 Obstructive sleep apnea (adult) (pediatric): Secondary | ICD-10-CM

## 2015-12-03 DIAGNOSIS — Z9989 Dependence on other enabling machines and devices: Secondary | ICD-10-CM

## 2015-12-03 MED ORDER — METOPROLOL TARTRATE 25 MG PO TABS: 25.0000 mg | ORAL_TABLET | Freq: Two times a day (BID) | ORAL | Status: DC

## 2015-12-03 MED ORDER — ALPRAZOLAM 1 MG PO TABS: ORAL_TABLET | ORAL | Status: DC

## 2015-12-03 MED ORDER — ASPIRIN EC 81 MG PO TBEC: 81.0000 mg | DELAYED_RELEASE_TABLET | Freq: Every day | ORAL | Status: DC

## 2015-12-03 NOTE — Patient Instructions (Signed)
Medication Instructions:  Your physician has recommended you make the following change in your medication:  1.  START Aspirin 81 mg taking 1 tablet daily 2.  START the Metoprolol Tartrate 25 mg taking 1 tablet twice a day   Labwork: None ordered  Testing/Procedures: Your physician has requested that you have a cardiac MRI OPEN MRI IF POSSIBLE. Cardiac MRI uses a computer to create images of your heart as its beating, producing both still and moving pictures of your heart and major blood vessels. For further information please visit [email or web address]. Please follow the instruction sheet given to you today for more information.   Follow-Up: Your physician recommends that you schedule a follow-up appointment in: 3-4 WEEKS WITH DR. Katrinka Blazing OR EXTENDER ON A DAY DR. Katrinka Blazing IS IN THE OFFICE    Any Other Special Instructions Will Be Listed Below (If Applicable). Magnetic Resonance Imaging Magnetic resonance imaging (MRI) is an imaging test that produces clear digital pictures of the inside of your body without using X-rays. The MRI scanner uses radio waves and a magnetic field to create the images. The MRI pictures may provide different details than images obtained through X-rays, CT scans, or ultrasounds. Contrast material may be injected to make MRI images even more clear. In a standard MRI scanner, the area of your body being studied will be in the center opening of the scanner. In open MRI scanners, the scanner does not entirely surround your body.  LET [facility] CARE PROVIDER KNOW ABOUT:  Previous surgeries you have had.  Any metal you may have in your body. The magnet used in MRI can cause metal objects in your body to move. This includes:  A pacemaker or any other implants, such as an implanted neurostimulator, a metallic ear implant, or a metallic object within the eye socket.  Metal splinters in your body.  Any bullet fragments.  A port for delivering insulin or chemotherapy.  Any  tattoos. Some red dyes contain iron which is sometimes a problem.  If you are pregnant or think you may be pregnant.  If you are breastfeeding.  If you are afraid of cramped spaces (claustrophobic). If claustrophobia is a problem, it usually can be relieved with medicines or the use of the open MRI scanner.  Any allergies you have.  All medicines you are taking, including vitamins, herbs, eye drops, creams, and over-the-counter medicines. RISKS AND COMPLICATIONS  Generally, MRI is a safe procedure. However, problems can occur and include:  If a metal implant is present but is undetected, it may be affected by the strong magnetic field. In addition, if the implant is close to the examination site, it may be hard to get high-quality images.  If you are pregnant:  MRI generally should be avoided during the first three months of pregnancy. It is not known what effects the MRI may have on a fetus. Ultrasound is preferred at this time unless a serious condition is suspected that is best studied by MRI. MRI should be considered if there is a substantial risk of missing the correct diagnosis if MRI is not done.  If you are breastfeeding:  You should inform your health care provider and ask how to proceed. You may pump breast milk before the exam for use until the contrast material, if used, has cleared from the body. BEFORE THE PROCEDURE   You will be asked to remove all metal, including:  Your watch, jewelry, and other metal objects.  Some makeup also contains traces of metal  and may need to be removed.  Braces and fillings normally are not a problem. PROCEDURE  You may be given earplugs or headphones to listen to music. The MRI scanner can be noisy.  You may be injected with contrast material.  The standard MRI is done in a long, magnetic chamber. You will lie down on a platform that slides into the magnetic chamber. Once inside, you will still be able to talk to the person performing  the test. The open MRI scanner is open on at least one side of the scanner.  You will be asked to hold very still. You will be told when you can shift position. You may have to wait a few minutes to make sure the images are readable. AFTER THE PROCEDURE   You may resume normal activities right away.  If you were given contrast material, it will pass naturally through your body within a day.  A person experienced in MRI (radiologist) will analyze the results and send a report to your health care provider, along with an explanation of the results.   This information is not intended to replace advice given to you by your health care provider. Make sure you discuss any questions you have with your health care provider.   Document Released: 06/11/2000 Document Revised: 07/05/2014 Document Reviewed: 08/09/2013 Elsevier Interactive Patient Education [employer]2016 Elsevier Inc.     If you need a refill on your cardiac medications before your next appointment, please call your pharmacy.

## 2015-12-03 NOTE — Progress Notes (Signed)
Cardiology Office Note    Date:  12/03/2015   ID:  Kyle FriarMicheal T Lute, DOB 1966/03/05, MRN [id number]  PCP:  Thora LanceEHINGER,ROBERT R, MD  Cardiologist:  Dr. Katrinka BlazingSmith  Chief Complaint: Shortness of breath & chest pain  History of Present Illness:    Kyle Cross is a 50 y.o. male with a history of HTN, OSA on CPAP and hypertensive cardiovascular disease (LVH and LAE on echo), but no prior documented CAD history who presents to clinic for evaluation of shortness of breath and chest pain.   A 2-D echo was done on 08/2013 for LVH. This showed an EF of 60-65%, moderate concentric hypertrophy, normal wall motion, G1DD, and severe LAE.   [facility] with chest pain and EKG demonstrated prominent precordial and lateral T wave inversions and new mild ST elevation in lead 3. Because of the severity of the discomfort and EKG abnormalities, he was taken back for emergency catheterization. This showed widely patent coronary arteries without evidence of significant coronary atherosclerosis, apical hypertrophic cardiomyopathy and LVEF 70% with elevated end-diastolic pressure compatible with diastolic heart failure. His cardiac enzymes remain negative. He was continued on home diltiazem CD 120 mg and discharged home with plans for cardiac MR for further evaluation of HOCM. However, I do not see that this was ever completed.   The patient saw Dr. Katrinka BlazingSmith for follow-up in 08/2014. He continued to complain of intermittent chest discomfort, dyspnea on exertion and exertional fatigue. His diltiazem CD was increased to 240 mg daily in hopes that this would improve his chest discomfort as well as lower his blood pressure. He is also recommended to lose weight.   Today he presents to clinic for evaluation of chest pain and shortness of breath. For the past few months he has been having more exertional chest pressure that has been getting worse and more intense. Resolved with rest. About 3-4 days he had worse  episode when he woke up from sleep due to chest pressure and sob. No nausea, diaphoresis and radiation of pain. Episode lasted for 20-30 minutes. He is not compliant with diet. No exercise. He can't lay flat. Denies PND, syncope, dizziness or LE Edema.    Past Medical History  Diagnosis Date  . HTN (hypertension)   . Hypertensive cardiovascular disease     a. LVH, LAE noted on ECG  . Neuropathy (HCC) 2012    felt to be infectious (viral)  . OSA on CPAP   . GERD (gastroesophageal reflux disease)   . Hypertrophic cardiomyopathy (HCC)     a. noted on LHC on 04/06/14.     Past Surgical History  Procedure Laterality Date  . Scrotal exploration  Oct 2012    foliculitis  . Cardiac catheterization  05/07/2014  . Vasectomy  02/1998  . Left heart catheterization with coronary angiogram N/A 05/07/2014    Procedure: LEFT HEART CATHETERIZATION WITH CORONARY ANGIOGRAM;  Surgeon: Lesleigh NoeHenry W Smith III, MD;  [facility];  Service: Cardiovascular;  Laterality: N/A;    Current Medications: Prior to Admission medications   Medication Sig Start Date End Date Taking? Authorizing Provider  diltiazem (CARDIZEM CD) 240 MG 24 hr capsule TAKE 1 CAPSULE (240 MG TOTAL) BY MOUTH DAILY. 09/05/15   Lyn RecordsHenry W Smith, MD  loratadine (CLARITIN) 10 MG tablet Take 1 tablet (10 mg total) by mouth daily as needed for allergies. 05/14/14   Abelino DerrickLuke K Kilroy, PA-C  oxymetazoline (AFRIN) 0.05 % nasal spray Place  1 spray into both nostrils every evening.    Historical Provider, MD  triamcinolone (NASACORT ALLERGY 24HR) 55 MCG/ACT AERO nasal inhaler Place 2 sprays into the nose daily.    Historical Provider, MD    Allergies:   Shrimp   Social History   Social History  . Marital Status: Married    Spouse Name: N/A  . Number of Children: N/A  . Years of Education: N/A   Social History Main Topics  . Smoking status: Never Smoker   . Smokeless tobacco: Never Used  . Alcohol Use: 0.6 oz/week    0 Standard drinks or  equivalent, 1 Glasses of wine per week  . Drug Use: No  . Sexual Activity: Yes   Other Topics Concern  . None   Social History Narrative     Family History:  The patient's family history includes Diabetes in his father; Hypertension in his mother.   ROS:   Please see the history of present illness.    ROS All other systems reviewed and are negative.   PHYSICAL EXAM:   VS:  BP 120/82 mmHg  Pulse 74  Ht 5\' 10"  (1.778 m)  Wt 274 lb 3.2 oz (124.376 kg)  BMI 39.34 kg/m2   GEN: Well nourished, well developed, in no acute distress HEENT: normal Neck: no JVD, carotid bruits, or masses Cardiac: RRR; no murmurs, rubs, or gallops,no edema  Respiratory:  clear to auscultation bilaterally, normal work of breathing GI: soft, nontender, nondistended, + BS MS: no deformity or atrophy Skin: warm and dry, no rash Neuro:  Alert and Oriented x 3, Strength and sensation are intact Psych: euthymic mood, full affect  Wt Readings from Last 3 Encounters:  12/03/15 274 lb 3.2 oz (124.376 kg)  08/27/14 275 lb (124.739 kg)  05/14/14 265 lb 4.8 oz (120.339 kg)      Studies/Labs Reviewed:   EKG:  EKG is ordered today.  The ekg ordered today demonstrates NSR, HR 74. Repolarization.   Recent Labs: No results found for requested labs within last 365 days.   Lipid Panel    Component Value Date/Time   CHOL 160 05/07/2014 1507   TRIG 88 05/07/2014 1507   HDL 48 05/07/2014 1507   CHOLHDL 3.3 05/07/2014 1507   VLDL 18 05/07/2014 1507   LDLCALC 94 05/07/2014 1507    Additional studies/ records that were reviewed today include:   Echocardiogram: 09/10/13 LV EF: 60% -  65%  ------------------------------------------------------------ Indications:   429.3 Cardiomegaly.  ------------------------------------------------------------ History:  PMH: LVH. Acquired from the patient and from the patient's chart.  ------------------------------------------------------------ Study  Conclusions  - Left ventricle: The cavity size was normal. There was moderate concentric hypertrophy. Systolic function was normal. The estimated ejection fraction was in the range of 60% to 65%. Wall motion was normal; there were no regional wall motion abnormalities. Doppler parameters are consistent with abnormal left ventricular relaxation (grade 1 diastolic dysfunction). The E/e' ratio is >10, suggesting elevated LV filling pressure. - Left atrium: Severely dilated (>40 ml/m2), - Inferior vena cava: The vessel was normal in size; the respirophasic diameter changes were in the normal range (= 50%); findings are consistent with normal central venous pressure. - Pericardium, extracardiac: There was no pericardial effusion.  Cardiac Catheterization:  05/07/14 HEMODYNAMICS: Aortic pressure 143/81 mmHg; LV pressure 156/0 mmHg; LVEDP 27 mmHg  ANGIOGRAPHIC DATA: The left main coronary artery is normal and widely patent.  The left anterior descending artery is widely patent and tortuous. It wraps around the  left ventricular apex.  The ramus intermedius is a large trifurcating vessel that is free of obstruction  The left circumflex artery is gives origin to 3 obtuse marginal branches that are relatively small and all widely patent..  The right coronary artery is nondominant and normal.  LEFT VENTRICULOGRAM: Left ventricular angiogram was done in the 30 RAO projection and revealed a spade-shaped LV cavity. EF is 70%.   IMPRESSIONS: 1. Widely patent coronary arteries without evidence of significant coronary atherosclerosis. 2. Apical hypertrophic cardiomyopathy 3. LVEF 70% with elevated end-diastolic pressure compatible with diastolic heart failure   RECOMMENDATION: Optimize therapy for hypertrophic cardiomyopathy. Cycle cardiac markers Consider other sources of chest discomfort if markers are negative..     ASSESSMENT & PLAN:    1.  Hypertrophic cardiomyopathy: The patient has worsening chest discomfort, dyspnea on exertion, and exertional fatigue. He has been compliant with medication however not with diet or exercise. Address importance of lifestyle changes and weight loss. Seems the patient is more educated now. Previously unable to afford echo and claustrophobic to MRI. Address importance of imaging need. given worsening of symptoms. After long discussion with patient, he agrees to proceeds with cardiac MRI (will premedicate him with Xanax and try to find open MRI). If unable, will get echo and eventually cardiac MRI with anesthesia as indicated.  Will start ASA  and metoprolol  BID.   2. Essential HTN: BP stable and controlled.   3. Chronic diastolic heart failure related to hypertrophic cardiomyopathy: Euvolemic. No syncope or dizziness.   4. OSA on CPAP --> currently in process of adjusting setting.   5. Obesity: Body mass index is 39.34 kg/(m^2). Advise to loose weight and heart healthy diet.   I have spent over 45 minutes, face-to-face with the patient and over 50% was spent in counseling and/or coordination of care. This includes review of prior records, care discussion with DOD (Dr. Elberta Fortis), need of  magine agent, developing & discussing different plans, discussion of disease and its complications,  life style changes.   Medication Adjustments/Labs and Tests Ordered: Current medicines are reviewed at length with the patient today.  Concerns regarding medicines are outlined above.  Medication changes, Labs and Tests ordered today are listed in the Patient Instructions below. Patient Instructions  Medication Instructions:  Your physician has recommended you make the following change in your medication:  1.  START Aspirin 81 mg taking 1 tablet daily 2.  START the Metoprolol Tartrate 25 mg taking 1 tablet twice a day   Labwork: None ordered  Testing/Procedures: Your physician has requested that you have  a cardiac MRI OPEN MRI IF POSSIBLE. Cardiac MRI uses a computer to create images of your heart as its beating, producing both still and moving pictures of your heart and major blood vessels. For further information please visit [email or web address]. Please follow the instruction sheet given to you today for more information.   Follow-Up: Your physician recommends that you schedule a follow-up appointment in: 3-4 WEEKS WITH DR. Katrinka Blazing OR EXTENDER ON A DAY DR. Katrinka Blazing IS IN THE OFFICE    Any Other Special Instructions Will Be Listed Below (If Applicable). Magnetic Resonance Imaging Magnetic resonance imaging (MRI) is an imaging test that produces clear digital pictures of the inside of your body without using X-rays. The MRI scanner uses radio waves and a magnetic field to create the images. The MRI pictures may provide different details than images obtained through X-rays, CT scans, or ultrasounds. Contrast material may be injected to make  MRI images even more clear. In a standard MRI scanner, the area of your body being studied will be in the center opening of the scanner. In open MRI scanners, the scanner does not entirely surround your body.  LET [facility] CARE PROVIDER KNOW ABOUT:  Previous surgeries you have had.  Any metal you may have in your body. The magnet used in MRI can cause metal objects in your body to move. This includes:  A pacemaker or any other implants, such as an implanted neurostimulator, a metallic ear implant, or a metallic object within the eye socket.  Metal splinters in your body.  Any bullet fragments.  A port for delivering insulin or chemotherapy.  Any tattoos. Some red dyes contain iron which is sometimes a problem.  If you are pregnant or think you may be pregnant.  If you are breastfeeding.  If you are afraid of cramped spaces (claustrophobic). If claustrophobia is a problem, it usually can be relieved with medicines or the use of the open MRI  scanner.  Any allergies you have.  All medicines you are taking, including vitamins, herbs, eye drops, creams, and over-the-counter medicines. RISKS AND COMPLICATIONS  Generally, MRI is a safe procedure. However, problems can occur and include:  If a metal implant is present but is undetected, it may be affected by the strong magnetic field. In addition, if the implant is close to the examination site, it may be hard to get high-quality images.  If you are pregnant:  MRI generally should be avoided during the first three months of pregnancy. It is not known what effects the MRI may have on a fetus. Ultrasound is preferred at this time unless a serious condition is suspected that is best studied by MRI. MRI should be considered if there is a substantial risk of missing the correct diagnosis if MRI is not done.  If you are breastfeeding:  You should inform your health care provider and ask how to proceed. You may pump breast milk before the exam for use until the contrast material, if used, has cleared from the body. BEFORE THE PROCEDURE   You will be asked to remove all metal, including:  Your watch, jewelry, and other metal objects.  Some makeup also contains traces of metal and may need to be removed.  Braces and fillings normally are not a problem. PROCEDURE  You may be given earplugs or headphones to listen to music. The MRI scanner can be noisy.  You may be injected with contrast material.  The standard MRI is done in a long, magnetic chamber. You will lie down on a platform that slides into the magnetic chamber. Once inside, you will still be able to talk to the person performing the test. The open MRI scanner is open on at least one side of the scanner.  You will be asked to hold very still. You will be told when you can shift position. You may have to wait a few minutes to make sure the images are readable. AFTER THE PROCEDURE   You may resume normal activities right  away.  If you were given contrast material, it will pass naturally through your body within a day.  A person experienced in MRI (radiologist) will analyze the results and send a report to your health care provider, along with an explanation of the results.   This information is not intended to replace advice given to you by your health care provider. Make sure you discuss any questions you  have with your health care provider.   Document Released: 06/11/2000 Document Revised: 07/05/2014 Document Reviewed: 08/09/2013 Elsevier Interactive Patient Education [employer]     If you need a refill on your cardiac medications before your next appointment, please call your pharmacy.       Lorelei Pont, [place]  12/03/2015 10:24 AM    [facility] [address] [place], [place], [place]  [address] Phone: [phone]; Fax: [phone]

## 2015-12-08 ENCOUNTER — Encounter: Payer: Self-pay | Admitting: Interventional Cardiology

## 2015-12-08 ENCOUNTER — Telehealth: Payer: Self-pay | Admitting: Interventional Cardiology

## 2015-12-08 NOTE — Telephone Encounter (Signed)
Called patient and gave him, date, time and location of cardiac MRI.  He also needs to take Xanax for anxiety.  I told him to not take the medication until the technician told him to take it.  Also to have a driver to take him home.  He was also questioning how much the MRI will cost.  I messaged manager Audelia HivesJ Parker to find out that information and call the patient.

## 2015-12-17 ENCOUNTER — Encounter: Payer: Self-pay | Admitting: Physician Assistant

## 2015-12-19 ENCOUNTER — Ambulatory Visit (HOSPITAL_COMMUNITY)
Admission: RE | Admit: 2015-12-19 | Discharge: 2015-12-19 | Disposition: A | Discharge status: Home / Self Care | Payer: 59 | Source: Ambulatory Visit | Attending: Physician Assistant | Admitting: Physician Assistant

## 2015-12-19 ENCOUNTER — Encounter: Payer: Self-pay | Admitting: *Deleted

## 2015-12-19 DIAGNOSIS — I517 Cardiomegaly: Secondary | ICD-10-CM | POA: Diagnosis not present

## 2015-12-19 DIAGNOSIS — F40240 Claustrophobia: Secondary | ICD-10-CM | POA: Insufficient documentation

## 2015-12-19 DIAGNOSIS — I422 Other hypertrophic cardiomyopathy: Secondary | ICD-10-CM | POA: Diagnosis not present

## 2015-12-19 DIAGNOSIS — I340 Nonrheumatic mitral (valve) insufficiency: Secondary | ICD-10-CM | POA: Insufficient documentation

## 2015-12-19 DIAGNOSIS — I071 Rheumatic tricuspid insufficiency: Secondary | ICD-10-CM | POA: Diagnosis not present

## 2015-12-19 LAB — CREATININE, SERUM
Creatinine, Ser: 0.96 mg/dL (ref 0.61–1.24)
GFR calc non Af Amer: >60 mL/min (ref >60)

## 2015-12-19 MED ORDER — GADOBENATE DIMEGLUMINE 529 MG/ML IV SOLN
35.0000 mL | Freq: Once | INTRAVENOUS | Status: AC | PRN
  Administered 2015-12-19: 35 mL via INTRAVENOUS

## 2015-12-26 ENCOUNTER — Encounter: Payer: Self-pay | Admitting: *Deleted

## 2015-12-27 NOTE — Progress Notes (Signed)
  This encounter was created in error - please disregard.    ROS     

## 2015-12-31 ENCOUNTER — Encounter: Payer: 59 | Admitting: Physician Assistant

## 2016-01-06 ENCOUNTER — Telehealth: Payer: Self-pay | Admitting: Interventional Cardiology

## 2016-01-06 DIAGNOSIS — R0789 Other chest pain: Secondary | ICD-10-CM

## 2016-01-06 NOTE — Telephone Encounter (Signed)
Pt called when he received the letters, after trying 3 times to reach by phone to discuss lab and mri results. Pt states that he is not having "pressure" in his throat and chest.  He was advised that Dr. Katrinka BlazingSmith mentioned possible GI workup, but is concerned about the new symptoms he is feeling. Pt is scheduled to see Boyce MediciBrittany Simmons, PA-C on a day that Dr. Katrinka BlazingSmith is in the office 01/26/16.  I'm not sure if Dr. Katrinka BlazingSmith wants pts appt moved up or what he would like to advise the pt. Pt aware that Dr Katrinka BlazingSmith will get a message and someone will call him as soon as we hear back. Pt agreeable with this plan.

## 2016-01-06 NOTE — Telephone Encounter (Signed)
New message     Patient calling for cardiac mri done on  6.23.2017

## 2016-01-07 NOTE — Telephone Encounter (Signed)
MRI study suggests hypertension related heart disease. I am confused by this message as he states he "is not having pressure in his throat and chest". If he feels okay, it is appropriate to wait until he is seen by Brittney. If there are symptoms that I need to be aware of, please let me know.

## 2016-01-08 NOTE — Telephone Encounter (Signed)
Needs stress Myoview followed by f/u with me.

## 2016-01-09 NOTE — Addendum Note (Signed)
Addended by: Burnetta SabinWITTY, Shataya Winkles K on: 01/09/2016 08:21 AM   Modules accepted: Orders

## 2016-01-09 NOTE — Telephone Encounter (Signed)
Per Dr. Katrinka BlazingSmith, ordered stress myoview. Pt made aware and is agreeable with this plan. Pt aware that order will be put in and someone from the scheduling dept will contact pt to schedule.

## 2016-01-13 ENCOUNTER — Telehealth: Payer: Self-pay | Admitting: *Deleted

## 2016-01-13 NOTE — Telephone Encounter (Signed)
Called pt and went over instructions for his exercise stress myoview. Instruction sheet mailed to pt.

## 2016-01-22 ENCOUNTER — Telehealth (HOSPITAL_COMMUNITY): Payer: Self-pay | Admitting: *Deleted

## 2016-01-22 NOTE — Telephone Encounter (Signed)
Left message on voicemail per DPR in reference to upcoming appointment scheduled on 01/27/16 detailed instructions given per Myocardial Perfusion Study Information Sheet for the test. LM to arrive 15 minutes early, and that it is imperative to arrive on time for appointment to keep from having the test rescheduled. If you need to cancel or reschedule your appointment, please call the office within 24 hours of your appointment. Failure to do so may result in a cancellation of your appointment, and a $50 no show fee. Phone number given for call back for any questions. Antionette Char, RN

## 2016-01-26 ENCOUNTER — Encounter (INDEPENDENT_AMBULATORY_CARE_PROVIDER_SITE_OTHER): Payer: Self-pay

## 2016-01-26 ENCOUNTER — Ambulatory Visit (INDEPENDENT_AMBULATORY_CARE_PROVIDER_SITE_OTHER): Payer: 59 | Admitting: Cardiology

## 2016-01-26 ENCOUNTER — Encounter: Payer: Self-pay | Admitting: Cardiology

## 2016-01-26 VITALS — BP 120/70 | HR 77 | Ht 70.0 in | Wt 271.1 lb

## 2016-01-26 DIAGNOSIS — I10 Essential (primary) hypertension: Secondary | ICD-10-CM

## 2016-01-26 NOTE — Patient Instructions (Signed)
Your physician recommends that you schedule a follow-up appointment in: March 2018 to see Dr Katrinka Blazing. You will be notified of your stress test results via phone call or mail.

## 2016-01-26 NOTE — Progress Notes (Signed)
01/26/2016 Kyle Cross   1966-05-26  [id number]  Primary Physician Thora Lance, MD Primary Cardiologist: Dr. Katrinka Blazing  Reason for Visit/CC: f/u for Chest Pain  HPI:   TONG PIECZYNSKI is a 50 y.o. male with a history of HTN, OSA on CPAP and hypertensive cardiovascular disease (LVH and LAE on echo), but no prior documented CAD history who presents to clinic for f/u for shortness of breath and chest pain.   He is followed by Dr. Katrinka Blazing. In March 2015, he underwent a 2-D echocardiogram which showed artery concentric left ventricular hypertrophy. EF was 60-65%. Wall motion was normal with grade 1 diastolic dysfunction and severe left atrial enlargement. In November 2015, he also underwent a cardiac catheterization in the setting of chest discomfort and EKG abnormalities. This showed widely patent coronary arteries without evidence of significant coronary atherosclerosis, apical hypertrophic cardiopathy and left jugular ejection fraction 70% with elevated end-diastolic pressure, compatible with diastolic heart failure. He was placed on PO Cardizem and discharged home with plans for cardiac MRI to further evaluate HOCM. However the patient failed to follow-up for his MRI at that time.  The patient saw Dr. Katrinka Blazing for follow-up in 08/2014. He continued to complain of intermittent chest discomfort, dyspnea on exertion and exertional fatigue. His diltiazem CD was increased to 240 mg daily in hopes that this would improve his chest discomfort as well as lower his blood pressure. He was also recommended to lose weight.    He was seen back in clinic 12/03/2015. He was evaluated by Ruthine Dose, PA-C.  He complained of worsening chest discomfort along with dyspnea with exertion and exertional fatigue. He was started on aspirin 81 mg along with Metroprolol 25 mg twice a day. He was also convinced to reconsider cardiac MRI. This was performed 12/19/2015. It was noted that the study was terminated early as the  patient felt severe claustrophobia. No post-gallium images were required. This demonstrated normal left venticular size with moderate concentric hypertrophy and normal systolic function. Ejection fraction is estimated 61%. There were no regional wall motion abnormalities. No systolic anterior motion was seen. He was also noted to have normal right ventricular size, normal thickness and systolic function. No regional wall motion abnormalities were noted.  The test results were routed to Dr. Katrinka Blazing for review. He did not feel that he would require further cardiac workup. He recommended considering GI workup if further CP. Of note, the patient is also scheduled for a nuclear stress tests tomorrow, 01/27/2016.  Since his last office visit he reports that he has noticed improvement with the addition of Metroprolol. He was initially taking this twice a day but cut back to once daily as he reports intolerance with his night doses. He developed bilateral pedal edema at night which he insists was related to Metroprolol. Since cutting back his Metroprolol dose to once a day, he has had no further issues with edema. His chest pain frequency has decreased. He still has some mild exertional dyspnea with activities. Blood pressure today is well-controlled at 120/70. Pulse rate 77.   Current Outpatient Prescriptions  Medication Sig Dispense Refill  . aspirin EC 81 MG tablet Take 1 tablet (81 mg total) by mouth daily. 90 tablet 3  . diltiazem (CARDIZEM CD) 240 MG 24 hr capsule TAKE 1 CAPSULE (240 MG TOTAL) BY MOUTH DAILY. 30 capsule 10  . metoprolol tartrate (LOPRESSOR) 25 MG tablet Take 1 tablet (25 mg total) by mouth 2 (two) times daily. 180 tablet 3  .  sildenafil (REVATIO) 20 MG tablet Take 2-5 tablets by mouth as directed. 30-60 mins before sex  12   No current facility-administered medications for this visit.     Allergies  Allergen Reactions  . Shrimp [Shellfish Allergy] Anaphylaxis    Social History    Social History  . Marital status: Married    Spouse name: N/A  . Number of children: N/A  . Years of education: N/A   Occupational History  . Not on file.   Social History Main Topics  . Smoking status: Never Smoker  . Smokeless tobacco: Never Used  . Alcohol use 0.6 oz/week    1 Glasses of wine per week  . Drug use: No  . Sexual activity: Yes   Other Topics Concern  . Not on file   Social History Narrative  . No narrative on file     Review of Systems: General: negative for chills, fever, night sweats or weight changes.  Cardiovascular: negative for chest pain, dyspnea on exertion, edema, orthopnea, palpitations, paroxysmal nocturnal dyspnea or shortness of breath Dermatological: negative for rash Respiratory: negative for cough or wheezing Urologic: negative for hematuria Abdominal: negative for nausea, vomiting, diarrhea, bright red blood per rectum, melena, or hematemesis Neurologic: negative for visual changes, syncope, or dizziness All other systems reviewed and are otherwise negative except as noted above.    Blood pressure 120/70, pulse 77, height 5\' 10"  (1.778 m), weight 271 lb 1.9 oz (123 kg).  General appearance: alert, cooperative, no distress and moderately obese Neck: no carotid bruit and no JVD Lungs: clear to auscultation bilaterally Heart: regular rate and rhythm, S1, S2 normal, no murmur, click, rub or gallop Extremities: no [place] Pulses: 2+ and symmetric Skin: Skin color, texture, turgor normal. No rashes or lesions or warm and dry Neurologic: Grossly normal  EKG not performed  ASSESSMENT AND PLAN:   1. Chest Pain: He reports improvement in symptoms after starting Metroprolol. He had a normal left heart catheterization in 2015. He has a history of HOCM. He was scheduled to undergo nuclear stress testing. This is scheduled for tomorrow. Continues with plans for stress test as ordered. Continue aspirin and Metroprolol.  2. HOCM: Recent cardiac  MRI revealed normal left venticular size with moderate concentric hypertrophy and normal systolic function. Ejection fraction is estimated  at61%. There were no regional wall motion abnormalities. No systolic anterior motion was seen. It is also noted to have normal right ventricular size, normal thickness and systolic function. No regional wall motion abnormalities were noted. Continue calcium channel blocker and beta blocker. He is not on any nitrates nor diuretics.  3. Hypertension: Blood pressure is well-controlled. Continue current regimen.   4. Obstructive sleep apnea: Patient reports full compliance with CPAP.  PLAN  follow-up with Dr. Katrinka Blazing in 6 months. If nuclear stress test is abnormal, we will have patient follow up sooner.   Mlissa Tamayo PA-C 01/26/2016 2:09 PM

## 2016-01-27 ENCOUNTER — Ambulatory Visit (HOSPITAL_COMMUNITY): Payer: 59 | Attending: Cardiovascular Disease

## 2016-01-27 DIAGNOSIS — R0789 Other chest pain: Secondary | ICD-10-CM | POA: Diagnosis not present

## 2016-01-27 DIAGNOSIS — I10 Essential (primary) hypertension: Secondary | ICD-10-CM | POA: Diagnosis not present

## 2016-01-27 DIAGNOSIS — R0609 Other forms of dyspnea: Secondary | ICD-10-CM | POA: Insufficient documentation

## 2016-01-27 MED ORDER — TECHNETIUM TC 99M TETROFOSMIN IV KIT
32.7000 | PACK | Freq: Once | INTRAVENOUS | Status: AC | PRN
  Administered 2016-01-27: 32.7 via INTRAVENOUS
  Filled 2016-01-27: qty 33

## 2016-01-28 ENCOUNTER — Ambulatory Visit (HOSPITAL_COMMUNITY): Payer: 59 | Attending: Cardiovascular Disease

## 2016-01-28 LAB — MYOCARDIAL PERFUSION IMAGING
CHL CUP MPHR: 172 {beats}/min
CHL CUP NUCLEAR SDS: 0
CSEPHR: 100 %
Estimated workload: 9.1 METS
Exercise duration (min): 7 min
Exercise duration (sec): 30 s
LV dias vol: 149 mL (ref 62–150)
LVSYSVOL: 75 mL
NUC STRESS TID: 1.08
Peak HR: 173 {beats}/min
RATE: 0.31
RPE: 15
Rest HR: 69 {beats}/min
SRS: 9
SSS: 9

## 2016-01-28 MED ORDER — TECHNETIUM TC 99M TETROFOSMIN IV KIT
32.3000 | PACK | Freq: Once | INTRAVENOUS | Status: AC | PRN
  Administered 2016-01-28: 32.3 via INTRAVENOUS
  Filled 2016-01-28: qty 32

## 2017-01-29 DIAGNOSIS — N451 Epididymitis: Secondary | ICD-10-CM | POA: Diagnosis not present

## 2017-02-18 ENCOUNTER — Other Ambulatory Visit: Payer: Self-pay | Admitting: Physician Assistant

## 2017-02-24 DIAGNOSIS — G4733 Obstructive sleep apnea (adult) (pediatric): Secondary | ICD-10-CM | POA: Diagnosis not present

## 2017-03-08 DIAGNOSIS — G4733 Obstructive sleep apnea (adult) (pediatric): Secondary | ICD-10-CM | POA: Diagnosis not present

## 2017-03-17 ENCOUNTER — Telehealth: Payer: Self-pay | Admitting: Interventional Cardiology

## 2017-03-17 NOTE — Telephone Encounter (Signed)
Pt states about 2 weeks ago he woke up during the night with slight pain in his right shoulder blade.  Pt rolled over, went back to sleep and when he woke up the next morning, had both in both arms.  Right is worse then left.  States pain is all in his bicep and joints.  Denies radiating pain through rest of arm.  Denies CP, SOB, lightheadedness or dizziness.  Pt did not have BP readings but states last two times it was checked it was a little higher, not terrible,  according to his wife who is a Engineer, civil (consulting).  Pt denies doing any heavy lifting recently.  Advised pt to contact PCP as this may not be cardiac related.  Will route to Dr. Katrinka Blazing for further review and recommendations.

## 2017-03-17 NOTE — Telephone Encounter (Signed)
New message    Pt is calling with concerns about a dull and aching arm pain that is not going away. He states he has had it for a couple weeks. He said he isn't sure if that is normal with an enlarged heart.

## 2017-03-17 NOTE — Telephone Encounter (Signed)
Agree, sounds non cardiac.

## 2017-03-25 ENCOUNTER — Other Ambulatory Visit: Payer: Self-pay | Admitting: Physician Assistant

## 2017-04-27 ENCOUNTER — Other Ambulatory Visit: Payer: Self-pay | Admitting: Physician Assistant

## 2017-08-26 DIAGNOSIS — G4733 Obstructive sleep apnea (adult) (pediatric): Secondary | ICD-10-CM | POA: Diagnosis not present

## 2017-08-26 DIAGNOSIS — I517 Cardiomegaly: Secondary | ICD-10-CM | POA: Diagnosis not present

## 2017-09-20 ENCOUNTER — Encounter: Payer: Self-pay | Admitting: Cardiology

## 2017-09-20 ENCOUNTER — Ambulatory Visit (INDEPENDENT_AMBULATORY_CARE_PROVIDER_SITE_OTHER): Payer: 59 | Admitting: Cardiology

## 2017-09-20 VITALS — BP 126/82 | HR 66 | Ht 70.0 in | Wt 289.0 lb

## 2017-09-20 DIAGNOSIS — R0600 Dyspnea, unspecified: Secondary | ICD-10-CM

## 2017-09-20 DIAGNOSIS — N529 Male erectile dysfunction, unspecified: Secondary | ICD-10-CM

## 2017-09-20 DIAGNOSIS — R5383 Other fatigue: Secondary | ICD-10-CM

## 2017-09-20 DIAGNOSIS — I10 Essential (primary) hypertension: Secondary | ICD-10-CM | POA: Diagnosis not present

## 2017-09-20 NOTE — Patient Instructions (Addendum)
Medication Instructions:  Your physician has recommended you make the following change in your medication:  1-STOP Metoprolol  Labwork: Your physician recommends that you have lab work today- TSH, CBC, BMET, HgbA1c  Testing/Procedures: NONE  Follow-Up: Your physician recommends that you schedule a follow-up appointment in: 3 weeks with Robbie LisBrittainy Simmons PA.   If you need a refill on your cardiac medications before your next appointment, please call your pharmacy.

## 2017-09-20 NOTE — Progress Notes (Signed)
09/20/2017 Kyle Cross   March 07, 1966  [id number]  Primary Physician Blair Heys, MD Primary Cardiologist: Dr. Katrinka Blazing  Reason for Visit/CC: Fatigue and Sexual Dysfunction   HPI:  LERRY CORDREY is a 52 y.o. male who is being seen today mainly with complaints of chronic fatigue and sexual dysfunction, felt related to cardiac medications.   Pt has been followed in the past by Dr. Katrinka Blazing. 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  In March 2015, he underwent a 2-D echocardiogram which showed concentric left ventricular hypertrophy. EF was 60-65%. Wall motion was normal with grade 1 diastolic dysfunction and severe left atrial enlargement. In November 2015, he also underwent a cardiac catheterization in the setting of chest discomfort and EKG abnormalities. This showed widely patent coronary arteries without evidence of significant coronary atherosclerosis, apical hypertrophic cardiopathy and left jugular ejection fraction 70% with elevated end-diastolic pressure, compatible with diastolic heart failure. He was placed on PO Cardizem and discharged home with plans for cardiac MRI to further evaluate HOCM. Pt intially failed to get MRI done. He was seen back in 2017 and noted worsening exertional dyspnea and CP. ASA was added as well as metoprolol 25 mg BID. Pt was ordered again to get MRI.  This was performed 12/19/2015. It was noted that the study was terminated early as the patient felt severe claustrophobia. No post-gallium images were required. This demonstrated normal left venticular size with moderate concentric hypertrophy and normal systolic function. Ejection fraction is estimated 61%. There were no regional wall motion abnormalities. No systolic anterior motion was seen. He was also noted to have normal right ventricular size, normal thickness and systolic function. No regional wall motion abnormalities  were noted. He also had a NST around that time that was negative for ischemia. The test results were routed to Dr. Katrinka Blazing for review. He did not feel that he would require further cardiac workup.  He is back today w/ his wife with complaints of chronic fatigue and sexual dysfunction. This is very bothersome to the patient and affecting his quality of life. He has no energy. He just "gives out" very easily. He denies CP. He notes that he previously reduced metoprolol down from BID to once daily due to Manny, which improved after this. No further recurrence. He is only taking 25 mg of Lopressor once daily and he takes Cardizem 240 mg daily. EKG shows NSR with repolarization abnormalities, unchanged from previous EKGs. HR 66 bpm. BP is controlled at 126/82. He reports full nightly compliance with CPAP.   Current Meds  Medication Sig  . diltiazem (CARDIZEM CD) 240 MG 24 hr capsule TAKE 1 CAPSULE (240 MG TOTAL) BY MOUTH DAILY.  . sildenafil (REVATIO) 20 MG tablet Take 2-5 tablets by mouth as directed. 30-60 mins before sex  . [DISCONTINUED] metoprolol tartrate (LOPRESSOR) 25 MG tablet Take 1 tablet by mouth twice daily. Patient is overdue for an appointment and must call and schedule for further refills. Final attempt   Allergies  Allergen Reactions  . Shrimp [Shellfish Allergy] Anaphylaxis   Past Medical History:  Diagnosis Date  . Dyspnea   . Fatigue   . GERD (gastroesophageal reflux disease)   . HTN (hypertension)   . Hypertensive cardiovascular disease    a. LVH, LAE noted on ECG  . Hypertrophic cardiomyopathy (HCC)    a. noted on LHC on 04/06/14.   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 OSA on CPAP    Family History  Problem Relation Age of Onset  . Diabetes Father   . Hypertension Mother   . Heart attack Neg Hx    Past Surgical History:  Procedure Laterality Date  . CARDIAC CATHETERIZATION  05/07/2014  . LEFT HEART CATHETERIZATION WITH CORONARY ANGIOGRAM N/A  05/07/2014   Procedure: LEFT HEART CATHETERIZATION WITH CORONARY ANGIOGRAM;  Surgeon: Lesleigh Noe, MD;  Location: [facility] CATH LAB;  Service: Cardiovascular;  Laterality: N/A;  . SCROTAL EXPLORATION  Oct 2012   foliculitis  . VASECTOMY  02/1998   Social History   Socioeconomic History  . Marital status: Married    Spouse name: Not on file  . Number of children: Not on file  . Years of education: Not on file  . Highest education level: Not on file  Occupational History  . Not on file  Social Needs  . Financial resource strain: Not on file  . Food insecurity:    Worry: Not on file    Inability: Not on file  . Transportation needs:    Medical: Not on file    Non-medical: Not on file  Tobacco Use  . Smoking status: Never Smoker  . Smokeless tobacco: Never Used  Substance and Sexual Activity  . Alcohol use: Yes    Alcohol/week: 0.6 oz    Types: 1 Glasses of wine per week  . Drug use: No  . Sexual activity: Yes  Lifestyle  . Physical activity:    Days per week: Not on file    Minutes per session: Not on file  . Stress: Not on file  Relationships  . Social connections:    Talks on phone: Not on file    Gets together: Not on file    Attends religious service: Not on file    Active member of club or organization: Not on file    Attends meetings of clubs or organizations: Not on file    Relationship status: Not on file  . Intimate partner violence:    Fear of current or ex partner: Not on file    Emotionally abused: Not on file    Physically abused: Not on file    Forced sexual activity: Not on file  Other Topics Concern  . Not on file  Social History Narrative  . Not on file     Review of Systems: General: negative for chills, fever, night sweats or weight changes.  Cardiovascular: negative for chest pain, dyspnea on exertion, edema, orthopnea, palpitations, paroxysmal nocturnal dyspnea or shortness of breath Dermatological: negative for rash Respiratory: negative  for cough or wheezing Urologic: negative for hematuria Abdominal: negative for nausea, vomiting, diarrhea, bright red blood per rectum, melena, or hematemesis Neurologic: negative for visual changes, syncope, or dizziness All other systems reviewed and are otherwise negative except as noted above.   Physical Exam:  Blood pressure 126/82, pulse 66, height 5\' 10"  (1.778 m), weight 289 lb (131.1 kg).  General appearance: alert, cooperative and no distress Neck: no carotid bruit and no JVD Lungs: clear to auscultation bilaterally Heart: regular rate and rhythm, S1, S2 normal, no murmur, click, rub or gallop Extremities: extremities normal, atraumatic, no cyanosis or edema Pulses: 2+ and symmetric Skin: Skin color, texture, turgor normal. No rashes or lesions Neurologic: Grossly normal  EKG NSR with repolarization abnormalities  -- personally reviewed   ASSESSMENT AND PLAN:   1. Chronic Fatigue and Sexual Dysfunction: the culprit  of his symptoms may be his beta blocker. I have reviewed pt case with Dr. Eldridge DaceVaranasi, DOD. He has reviewed precious echo (prior concerns for HOCM, MRI ok). He feels that it would be ok to d/c Lopressor. Given his BP and HR are both well controlled, we will hold off on titration of his CCB at this time. We will re-evaluate BP at his f/u office visit in 2-3 weeks and will further adjust CCB if needed. We will also check a TSH, BMP and CBC to r/o other etiologies to explain his fatigue. We will check a Hgb A1c as well (was 6.1 c/w prediabetes in 2015>> not rechecked since then). DM can also cause ED. Pt instructed to continue nightly use of his CPAP for OSA.   2. HTN: controlled on current regimen. We will d/c BB given fatigue and ED. Continue Cardizem. Pt to monitor BP closely at home and will bring back a log of home BP readings at his f/u visit in 2-3 weeks.   3. LVH: 2D echo in 2015 showed concentric left ventricular hypertrophy. EF was 60-65%. Wall motion was normal  with grade 1 diastolic dysfunction and severe left atrial enlargement. Cardiac MRI in 2017 demonstrated normal left venticular size with moderate concentric hypertrophy and normal systolic function. Ejection fraction was estimated at 61%. There were no regional wall motion abnormalities. No systolic anterior motion was seen. He was also noted to have normal right ventricular size, normal thickness and systolic function. No regional wall motion abnormalities were noted. The test results were routed to Dr. Katrinka BlazingSmith for review. He did not feel that he would require further cardiac workup. His BP is controlled with current regimen.   4. OSA: fully compliant with CPAP nightly.   5. Prediabetes: last Hgb A1c was in 2015 and was elevated at 6.1. Given his other risk factors (HTN and obesity), we will recheck Hgb A1c to screen for DM.  Follow-Up  In 2-3 weeks to assess response to medication adjustments.   Alexander Mcauley Delmer IslamSimmons PA-C, [facility] 09/20/2017 3:59 PM

## 2017-09-21 LAB — BASIC METABOLIC PANEL
BUN / CREAT RATIO: 15 (ref 9–20)
BUN: 13 mg/dL (ref 6–24)
CO2: 25 mmol/L (ref 20–29)
Calcium: 9.5 mg/dL (ref 8.7–10.2)
Chloride: 102 mmol/L (ref 96–106)
Creatinine, Ser: 0.84 mg/dL (ref 0.76–1.27)
GFR, EST AFRICAN AMERICAN: 119 mL/min/{1.73_m2} (ref >59)
GFR, EST NON AFRICAN AMERICAN: 103 mL/min/{1.73_m2} (ref >59)
Glucose: 101 mg/dL — ABNORMAL HIGH (ref 65–99)
POTASSIUM: 3.8 mmol/L (ref 3.5–5.2)
Sodium: 142 mmol/L (ref 134–144)

## 2017-09-21 LAB — CBC WITH DIFFERENTIAL/PLATELET
BASOS ABS: 0 10*3/uL (ref 0.0–0.2)
BASOS: 0 %
EOS (ABSOLUTE): 0.1 10*3/uL (ref 0.0–0.4)
Eos: 1 %
HEMOGLOBIN: 13.8 g/dL (ref 13.0–17.7)
Hematocrit: 41.5 % (ref 37.5–51.0)
Immature Grans (Abs): 0 10*3/uL (ref 0.0–0.1)
Immature Granulocytes: 0 %
LYMPHS ABS: 2.1 10*3/uL (ref 0.7–3.1)
Lymphs: 37 %
MCH: 27.4 pg (ref 26.6–33.0)
MCHC: 33.3 g/dL (ref 31.5–35.7)
MCV: 83 fL (ref 79–97)
Monocytes Absolute: 0.5 10*3/uL (ref 0.1–0.9)
Monocytes: 9 %
NEUTROS ABS: 3.1 10*3/uL (ref 1.4–7.0)
Neutrophils: 53 %
Platelets: 244 10*3/uL (ref 150–379)
RBC: 5.03 x10E6/uL (ref 4.14–5.80)
RDW: 13.6 % (ref 12.3–15.4)
WBC: 5.7 10*3/uL (ref 3.4–10.8)

## 2017-09-21 LAB — HEMOGLOBIN A1C
Est. average glucose Bld gHb Est-mCnc: 123 mg/dL
HEMOGLOBIN A1C: 5.9 % — AB (ref 4.8–5.6)

## 2017-09-21 LAB — TSH: TSH: 0.84 u[IU]/mL (ref 0.450–4.500)

## 2017-10-18 ENCOUNTER — Ambulatory Visit (INDEPENDENT_AMBULATORY_CARE_PROVIDER_SITE_OTHER): Payer: 59 | Admitting: Cardiology

## 2017-10-18 ENCOUNTER — Encounter: Payer: Self-pay | Admitting: Cardiology

## 2017-10-18 VITALS — BP 126/84 | HR 66 | Ht 68.0 in | Wt 287.8 lb

## 2017-10-18 DIAGNOSIS — I10 Essential (primary) hypertension: Secondary | ICD-10-CM | POA: Diagnosis not present

## 2017-10-18 MED ORDER — DILTIAZEM HCL ER COATED BEADS 240 MG PO CP24: 240.0000 mg | ORAL_CAPSULE | Freq: Every day | ORAL | 3 refills | Status: DC

## 2017-10-18 NOTE — Progress Notes (Signed)
10/18/2017 Kyle Cross   1965-09-05  [id number]  Primary Physician Blair Heys, MD Primary Cardiologist: Dr. Katrinka Blazing   Reason for Visit/CC: F/u for Fatigue and Erectile Dysfunction, Medication monitoring  HPI:  Kyle Cross is a 52 y.o. male who presents to clinic for repeat evaluation after recent medication change.  Pt has been followed in the past by Dr. Katrinka Blazing. 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  In [place] underwent a 2-D echocardiogram which showed concentric left ventricular hypertrophy. EF was 60-65%. Wall motion was normal with grade 1 diastolic dysfunction and severe left atrial enlargement. In November 2015,he also underwent a cardiac catheterization in the setting of chest discomfort and EKG abnormalities. This showed widely patent coronary arteries without evidence of significant coronary atherosclerosis, apical hypertrophic cardiopathy and left jugular ejection fraction 70% with elevated end-diastolic pressure,compatible with diastolic heart failure. He was placed on POCardizem and discharged home with plans for cardiac MRI to further evaluate HOCM. Pt intially failed to get MRI done. He was seen back in 2017 and noted worsening exertional dyspnea and CP. ASA was added as well as metoprolol 25 mg BID. Pt was ordered again to get MRI.  This was performed 12/19/2015. It was noted that the study was terminated early as the patient felt severeclaustrophobia. No post-gallium images were required. This demonstrated normal leftventicularsize with moderate concentric hypertrophy and normal systolic function. Ejection fraction is estimated 61%. There were no regional wall motion abnormalities. No systolic anterior motion was seen. He wasalso noted to have normal right ventricular size, normal thickness and systolic function. No regional wall motion abnormalities were noted. He  also had a NST around that time that was negative for ischemia. The test results wereroutedto Dr. Katrinka Blazing for review. He did not feel that he would require further cardiac workup.  I evaluated him September 20, 2017 when he presented with complaints of chronic fatigue and sexual dysfunction.  This was very bothersome to the patient and he reported that it was affecting his quality of life.  He reported that he had no energy and felt like he would give out very easily with light activity.  He denied any chest pain.  Medications were reviewed and it was felt that his symptoms were likely related to chronic therapy with beta-blockers.  Case was discussed with DOD that day which was Dr. Eldridge Dace  He reviewed previous echo which showed prior concern for HOCM (MRI was ok).  We decided that it would be okay to discontinue metoprolol. In addition, there were no other etiologies to explain his fatigue. We did check basic laboratory work including a TSH, BMP and CBC all of which were unremarkable.  Hemoglobin A1c was also checked given his history of prediabetes and this was stable in the prediabetic range at 5.9  He presents back to clinic today with his wife.  He reports significant improvement in symptoms.  Less fatigue.  He has more energy.  Notable improvement in sexual dysfunction.  This is a lot better.  His wife agrees.  While off of metoprolol, his heart rate and blood pressure have both remained stable.  He has been checking this regularly at home and systolic blood pressures have been under 140 and diastolic blood pressures have been in the 70-80 range.  His blood pressure today in clinic is 126/84.  Heart rate 66 bpm.   Current Meds  Medication Sig  .  sildenafil (REVATIO) 20 MG tablet Take 2-5 tablets by mouth as directed. 30-60 mins before sex  . [DISCONTINUED] diltiazem (CARDIZEM CD) 240 MG 24 hr capsule TAKE 1 CAPSULE (240 MG TOTAL) BY MOUTH DAILY.   Allergies  Allergen Reactions  . Shrimp [Shellfish  Allergy] Anaphylaxis  . Metoprolol Tartrate Other (See Comments)    Fatigue and erectile dysfunction    Past Medical History:  Diagnosis Date  . Dyspnea   . Fatigue   . GERD (gastroesophageal reflux disease)   . HTN (hypertension)   . Hypertensive cardiovascular disease    a. LVH, LAE noted on ECG  . Hypertrophic cardiomyopathy (HCC)    a. noted on LHC on 04/06/14.   Marland Kitchen. 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 OSA on CPAP    Family History  Problem Relation Age of Onset  . Diabetes Father   . Hypertension Mother   . Heart attack Neg Hx    Past Surgical History:  Procedure Laterality Date  . CARDIAC CATHETERIZATION  05/07/2014  . LEFT HEART CATHETERIZATION WITH CORONARY ANGIOGRAM N/A 05/07/2014   Procedure: LEFT HEART CATHETERIZATION WITH CORONARY ANGIOGRAM;  Surgeon: Lesleigh NoeHenry W Smith III, MD;  [facility];  Service: Cardiovascular;  Laterality: N/A;  . SCROTAL EXPLORATION  Oct 2012   foliculitis  . VASECTOMY  02/1998   Social History   Socioeconomic History  . Marital status: Married    Spouse name: Not on file  . Number of children: Not on file  . Years of education: Not on file  . Highest education level: Not on file  Occupational History  . Not on file  Social Needs  . Financial resource strain: Not on file  . Food insecurity:    Worry: Not on file    Inability: Not on file  . Transportation needs:    Medical: Not on file    Non-medical: Not on file  Tobacco Use  . Smoking status: Never Smoker  . Smokeless tobacco: Never Used  Substance and Sexual Activity  . Alcohol use: Yes    Alcohol/week: 0.6 oz    Types: 1 Glasses of wine per week  . Drug use: No  . Sexual activity: Yes  Lifestyle  . Physical activity:    Days per week: Not on file    Minutes per session: Not on file  . Stress: Not on file  Relationships  . Social connections:    Talks on phone: Not on file    Gets together: Not on file    Attends religious service: Not on  file    Active member of club or organization: Not on file    Attends meetings of clubs or organizations: Not on file    Relationship status: Not on file  . Intimate partner violence:    Fear of current or ex partner: Not on file    Emotionally abused: Not on file    Physically abused: Not on file    Forced sexual activity: Not on file  Other Topics Concern  . Not on file  Social History Narrative  . Not on file     Review of Systems: General: negative for chills, fever, night sweats or weight changes.  Cardiovascular: negative for chest pain, dyspnea on exertion, edema, orthopnea, palpitations, paroxysmal nocturnal dyspnea or shortness of breath Dermatological: negative for rash Respiratory: negative for cough or wheezing Urologic: negative for hematuria Abdominal: negative for nausea, vomiting, diarrhea, bright red blood per rectum, melena,  or hematemesis Neurologic: negative for visual changes, syncope, or dizziness All other systems reviewed and are otherwise negative except as noted above.   Physical Exam:  Blood pressure 126/84, pulse 66, height 5\' 8"  (1.727 m), weight 287 lb 12.8 oz (130.5 kg), SpO2 95 %.  General appearance: alert, cooperative, no distress and morbidly obese Neck: no carotid bruit and no JVD Lungs: clear to auscultation bilaterally Heart: regular rate and rhythm, S1, S2 normal, no murmur, click, rub or gallop Extremities: extremities normal, atraumatic, no cyanosis or edema Pulses: 2+ and symmetric Skin: Skin color, texture, turgor normal. No rashes or lesions Neurologic: Grossly normal  EKG not performed -- personally reviewed   ASSESSMENT AND PLAN:   1. Fatigue and Sexual Dysfunction: related to chronic beta-blocker use.  This is significantly better after metoprolol was discontinued.  His wife agrees.  Blood pressure and heart rate have both remained stable without metoprolol.  2. Pre-diabetes/ Obesity: we discussed importance of weight loss. I  encouraged physical activity, walking at least 30 min a day. He seems motivated to try this. His wife wants to help him with this.    3. OSA: reports full compliance with CPAP. This is followed by PCP.   4. HTN: controlled with Cardizem. BP 126/84.   Follow-Up in 1 year w/ Dr. Katrinka Blazing.   Kyle Cross, MHS [facility] 10/18/2017 3:12 PM

## 2017-10-18 NOTE — Patient Instructions (Signed)
Medication Instructions:  Your physician recommends that you continue on your current medications as directed. Please refer to the Current Medication list given to you today.   Labwork: None ordered  Testing/Procedures: None ordered  Follow-Up: Your physician wants you to follow-up in: 1 YEAR WITH DR. SMITH  You will receive a reminder letter in the mail two months in advance. If you don't receive a letter, please call our office to schedule the follow-up appointment.   Any Other Special Instructions Will Be Listed Below (If Applicable).    If you need a refill on your cardiac medications before your next appointment, please call your pharmacy.   

## 2018-02-17 ENCOUNTER — Ambulatory Visit (INDEPENDENT_AMBULATORY_CARE_PROVIDER_SITE_OTHER): Payer: 59 | Admitting: Podiatry

## 2018-02-17 ENCOUNTER — Encounter: Payer: Self-pay | Admitting: Podiatry

## 2018-02-17 DIAGNOSIS — L600 Ingrowing nail: Secondary | ICD-10-CM | POA: Diagnosis not present

## 2018-02-17 MED ORDER — GENTAMICIN SULFATE 0.1 % EX CREA: 1.0000 "application " | TOPICAL_CREAM | Freq: Three times a day (TID) | CUTANEOUS | 1 refills | Status: DC

## 2018-02-20 NOTE — Progress Notes (Signed)
   Subjective: Patient presents today for evaluation of pain to the medial and lateral borders of the right hallux and the medial border of the left hallux that has been ongoing intermittently for several years. Patient is concerned for possible ingrown nail. He has been trimming the nails out himself, applying antibiotic cream and antiseptic spray with no significant relief. Patient presents today for further treatment and evaluation.  Past Medical History:  Diagnosis Date  . Dyspnea   . Fatigue   . GERD (gastroesophageal reflux disease)   . HTN (hypertension)   . Hypertensive cardiovascular disease    a. LVH, LAE noted on ECG  . Hypertrophic cardiomyopathy (HCC)    a. noted on LHC on 04/06/14.   Marland Kitchen. 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 OSA on CPAP     Objective:  General: Well developed, nourished, in no acute distress, alert and oriented x3   Dermatology: Skin is warm, dry and supple bilateral. Medial and lateral borders of the right hallux and medial border of the left hallux appears to be erythematous with evidence of an ingrowing nail. Pain on palpation noted to the border of the nail fold. The remaining nails appear unremarkable at this time. There are no open sores, lesions.  Vascular: Dorsalis Pedis artery and Posterior Tibial artery pedal pulses palpable. No lower extremity edema noted.   Neruologic: Grossly intact via light touch bilateral.  Musculoskeletal: Muscular strength within normal limits in all groups bilateral. Normal range of motion noted to all pedal and ankle joints.   Assesement: #1 Paronychia with ingrowing nail medial/lateral borders right hallux, medial border left hallux  #2 Pain in toe #3 Incurvated nail  Plan of Care:  1. Patient evaluated.  2. Discussed treatment alternatives and plan of care. Explained nail avulsion procedure and post procedure course to patient. 3. Patient opted for permanent partial nail avulsion.  4. Prior to  procedure, local anesthesia infiltration utilized using 3 ml of a 50:50 mixture of 2% plain lidocaine and 0.5% plain marcaine in a normal hallux block fashion and a betadine prep performed.  5. Partial permanent nail avulsion with chemical matrixectomy performed using 3x30sec applications of phenol followed by alcohol flush.  6. Light dressing applied. 7. Prescription for gentamicin cream provided to patient.  8. Return to clinic in 2 weeks.   Felecia ShellingBrent M. Strummer Canipe, DPM [facility]  Dr. Felecia ShellingBrent M. Kenzee Bassin, DPM    [address]                                        [place], [place] [address]                Office [phone]) [phone]  Fax [phone]

## 2018-03-07 ENCOUNTER — Ambulatory Visit (INDEPENDENT_AMBULATORY_CARE_PROVIDER_SITE_OTHER): Payer: 59 | Admitting: Podiatry

## 2018-03-07 ENCOUNTER — Encounter: Payer: Self-pay | Admitting: Podiatry

## 2018-03-07 DIAGNOSIS — L600 Ingrowing nail: Secondary | ICD-10-CM | POA: Diagnosis not present

## 2018-03-09 NOTE — Progress Notes (Signed)
   Subjective: Patient presents today 2 weeks post ingrown nail permanent nail avulsion procedure of the medial and lateral borders of the right hallux and the medial border of the left hallux. Patient states that the toe and nail fold is feeling much better. Patient is here for further evaluation and treatment.   Past Medical History:  Diagnosis Date  . Dyspnea   . Fatigue   . GERD (gastroesophageal reflux disease)   . HTN (hypertension)   . Hypertensive cardiovascular disease    a. LVH, LAE noted on ECG  . Hypertrophic cardiomyopathy (HCC)    a. noted on LHC on 04/06/14.   Marland Kitchen. 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 OSA on CPAP     Objective: Skin is warm, dry and supple. Nail and respective nail fold appears to be healing appropriately. Open wound to the associated nail fold with a granular wound base and moderate amount of fibrotic tissue. Minimal drainage noted. Mild erythema around the periungual region likely due to phenol chemical matricectomy.  Assessment: #1 postop permanent partial nail avulsion medial and lateral borders right hallux, medial border left hallux #2 open wound periungual nail fold of respective digit.   Plan of care: #1 patient was evaluated  #2 debridement of open wound was performed to the periungual border of the respective toe using a currette. Antibiotic ointment and Band-Aid was applied. #3 patient is to return to clinic on a PRN basis.   Felecia ShellingBrent M. Evans, DPM [facility]  Dr. Felecia ShellingBrent M. Evans, DPM    [address]                                        [place], [place] [address]                Office [phone]  Fax [phone]

## 2018-03-21 DIAGNOSIS — G4733 Obstructive sleep apnea (adult) (pediatric): Secondary | ICD-10-CM | POA: Diagnosis not present

## 2018-03-27 ENCOUNTER — Other Ambulatory Visit: Payer: Self-pay | Admitting: Urology

## 2018-03-28 LAB — PSA: PROSTATE SPECIFIC AG, SERUM: 1 ng/mL (ref 0.0–4.0)

## 2018-04-04 ENCOUNTER — Other Ambulatory Visit (HOSPITAL_BASED_OUTPATIENT_CLINIC_OR_DEPARTMENT_OTHER): Payer: Self-pay

## 2018-04-04 DIAGNOSIS — G4733 Obstructive sleep apnea (adult) (pediatric): Secondary | ICD-10-CM

## 2018-04-04 DIAGNOSIS — G4719 Other hypersomnia: Secondary | ICD-10-CM

## 2018-04-20 DIAGNOSIS — G4733 Obstructive sleep apnea (adult) (pediatric): Secondary | ICD-10-CM | POA: Diagnosis not present

## 2018-05-03 DIAGNOSIS — G4733 Obstructive sleep apnea (adult) (pediatric): Secondary | ICD-10-CM | POA: Diagnosis not present

## 2018-05-07 ENCOUNTER — Encounter (HOSPITAL_BASED_OUTPATIENT_CLINIC_OR_DEPARTMENT_OTHER): Payer: 59

## 2018-05-08 ENCOUNTER — Encounter (HOSPITAL_BASED_OUTPATIENT_CLINIC_OR_DEPARTMENT_OTHER): Payer: 59

## 2018-05-14 ENCOUNTER — Ambulatory Visit (HOSPITAL_BASED_OUTPATIENT_CLINIC_OR_DEPARTMENT_OTHER): Payer: 59 | Attending: Internal Medicine | Admitting: Internal Medicine

## 2018-05-14 VITALS — Ht 67.0 in | Wt 290.0 lb

## 2018-05-14 DIAGNOSIS — G4710 Hypersomnia, unspecified: Secondary | ICD-10-CM | POA: Insufficient documentation

## 2018-05-14 DIAGNOSIS — G4733 Obstructive sleep apnea (adult) (pediatric): Secondary | ICD-10-CM | POA: Insufficient documentation

## 2018-05-14 DIAGNOSIS — G4719 Other hypersomnia: Secondary | ICD-10-CM

## 2018-05-15 ENCOUNTER — Ambulatory Visit (HOSPITAL_BASED_OUTPATIENT_CLINIC_OR_DEPARTMENT_OTHER): Payer: 59 | Attending: Internal Medicine | Admitting: Internal Medicine

## 2018-05-15 DIAGNOSIS — G4733 Obstructive sleep apnea (adult) (pediatric): Secondary | ICD-10-CM

## 2018-05-15 DIAGNOSIS — G4719 Other hypersomnia: Secondary | ICD-10-CM

## 2018-05-15 DIAGNOSIS — G4711 Idiopathic hypersomnia with long sleep time: Secondary | ICD-10-CM | POA: Diagnosis not present

## 2018-05-21 NOTE — Procedures (Signed)
    NAME: Kyle FriarMicheal T Salser DATE OF BIRTH:  [birth year] MEDICAL RECORD NUMBER [id number]  LOCATION: [facility]  PHYSICIAN: Deretha EmoryJames C Osborne  DATE OF STUDY: 05/15/2018  SLEEP STUDY TYPE: Multiple Sleep Latency Test               REFERRING PHYSICIAN: Deretha Emorysborne, James C, MD  INDICATION FOR STUDY: Severe daytime sleepiness in patient with adequately controlled OSA on CPAP  EPWORTH SLEEPINESS SCORE:  16 HEIGHT: 5\' 7"  (170.2 cm)  WEIGHT: 290 lb (131.5 kg)    Body mass index is 45.42 kg/m.  NECK SIZE: 18 in.  MEDICATIONS  Patient self administered medications include: N/A. Medications administered during study include No sleep medicine administered.Marland Kitchen.   SLEEP STUDY TECHNIQUE  A multiple sleep latency test was performed. The channels recorded and monitored were central and occipital EEG, electrooculogram (EOG), submentalis EMG (chin), and electrocardiogram.   TECHNICAL COMMENTS  Comments added by Technician: NONE Comments added by Scorer: N/A   IMPRESSIONS  Pathologic sleepiness is suggested by short mean sleep latency of 7:22 minutes No sleep onset REMs present. This study does not suggest narcolepsy. Total number of naps attempted: 5 . Total number of naps with sleep attained: 5  DIAGNOSIS  Idiopathic hypersomnia (327.11 [G47.11 ICD-10])  RECOMMENDATIONS  Return for follow up and management of Idiopathic Hypersomnia.  Deretha EmoryJames C Osborne Sleep specialist, American Board of Internal Medicine  ELECTRONICALLY SIGNED ON:  05/21/2018, 8:45 PM [facility] PH: [phone]   FX: [phone] ACCREDITED BY THE AMERICAN ACADEMY OF SLEEP MEDICINE

## 2018-05-21 NOTE — Procedures (Signed)
   NAME: Kyle FriarMicheal T Kestler DATE OF BIRTH:  1966/06/19 MEDICAL RECORD NUMBER [id number]  LOCATION: [facility]  PHYSICIAN: Deretha EmoryJames C Osborne  DATE OF STUDY: 05/14/2018  SLEEP STUDY TYPE: Nocturnal Polysomnogram               REFERRING PHYSICIAN: Deretha Emorysborne, James C, MD  INDICATION FOR STUDY: Severe excessive daytime sleepiness despite use of CPAP.   EPWORTH SLEEPINESS SCORE:  16 HEIGHT: 5\' 7"  (170.2 cm)  WEIGHT: 290 lb (131.5 kg)    Body mass index is 45.42 kg/m.  NECK SIZE:   in.  MEDICATIONS  Patient self administered medications include: N/A. Medications administered during study include No sleep medicine administered.Marland Kitchen.   SLEEP STUDY TECHNIQUE  A multi-channel overnight Polysomnography study was performed while the patient was auto-adjusting CPAP. The channels recorded and monitored were central and occipital EEG, electrooculogram (EOG), submentalis EMG (chin), nasal and oral airflow, thoracic and abdominal wall motion, anterior tibialis EMG, snore microphone, electrocardiogram, and a pulse oximetry.  TECHNICAL COMMENTS  Comments added by Technician: Patient was on APAP throughout the night Comments added by Scorer: N/A   SLEEP ARCHITECTURE  The study was initiated at 10:20:29 PM and terminated at 6:04:22 AM. The total recorded time was 463.9 minutes. EEG confirmed total sleep time was 396.7 minutes yielding a sleep efficiency of 85.5%%. Sleep onset after lights out was 25.6 minutes with a REM latency of 77.0 minutes. The patient spent 3.7%% of the night in stage N1 sleep, 72.5%% in stage N2 sleep, 0.0%% in stage N3 and 23.9% in REM. Wake after sleep onset (WASO) was 41.5 minutes. The Arousal Index was 4.1/hour.   RESPIRATORY PARAMETERS  There were a total of 9 respiratory disturbances out of which 5 were apneas ( 2 obstructive, 0 mixed, 3 central) and 4 hypopneas. The apnea/hypopnea index (AHI) was 1.4 events/hour. The central sleep apnea index was 0.5 events/hour. The  REM AHI was 3.8 events/hour and NREM AHI was 0.6 events/hour. The supine AHI was 1.3 events/hour and the non supine AHI was 1.6 supine during 72.27% of sleep. Respiratory disturbances were associated with oxygen desaturation down to a nadir of 92.0% during sleep. The mean oxygen saturation during the study was 96.1%. The cumulative time under 88% oxygen saturation was 5.5 minutes.   LEG MOVEMENT DATA  The total leg movements were 57 with a resulting leg movement index of 8.6/hr . Associated arousal with leg movement index was 0.0/hr.   CARDIAC DATA  The underlying cardiac rhythm was most consistent with sinus rhythm. Mean heart rate during sleep was 62.4 bpm. Additional rhythm abnormalities include None.   IMPRESSIONS  Well controlled OSA on auto-adjusting CPAP No significant periodic leg movements(PLMs) during sleep.   DIAGNOSIS  Excessive daytime sleepiness Obstructive sleep apnea.   RECOMMENDATIONS  May proceed with MSLT  Deretha EmoryJames C Osborne Sleep specialist, American Board of Internal Medicine  ELECTRONICALLY SIGNED ON:  05/21/2018, 8:27 PM [facility] PH: [phone]   FX: [phone] ACCREDITED BY THE AMERICAN ACADEMY OF SLEEP MEDICINE

## 2018-06-02 DIAGNOSIS — G4733 Obstructive sleep apnea (adult) (pediatric): Secondary | ICD-10-CM | POA: Diagnosis not present

## 2018-06-13 DIAGNOSIS — G4711 Idiopathic hypersomnia with long sleep time: Secondary | ICD-10-CM | POA: Diagnosis not present

## 2018-06-13 DIAGNOSIS — G4733 Obstructive sleep apnea (adult) (pediatric): Secondary | ICD-10-CM | POA: Diagnosis not present

## 2018-06-13 DIAGNOSIS — I517 Cardiomegaly: Secondary | ICD-10-CM | POA: Diagnosis not present

## 2018-07-03 DIAGNOSIS — G4733 Obstructive sleep apnea (adult) (pediatric): Secondary | ICD-10-CM | POA: Diagnosis not present

## 2018-08-03 DIAGNOSIS — G4733 Obstructive sleep apnea (adult) (pediatric): Secondary | ICD-10-CM | POA: Diagnosis not present

## 2018-08-15 DIAGNOSIS — G4711 Idiopathic hypersomnia with long sleep time: Secondary | ICD-10-CM | POA: Diagnosis not present

## 2018-08-28 DIAGNOSIS — N529 Male erectile dysfunction, unspecified: Secondary | ICD-10-CM | POA: Diagnosis not present

## 2018-08-28 DIAGNOSIS — I517 Cardiomegaly: Secondary | ICD-10-CM | POA: Diagnosis not present

## 2018-08-28 DIAGNOSIS — G4733 Obstructive sleep apnea (adult) (pediatric): Secondary | ICD-10-CM | POA: Diagnosis not present

## 2018-08-28 DIAGNOSIS — Z8249 Family history of ischemic heart disease and other diseases of the circulatory system: Secondary | ICD-10-CM | POA: Diagnosis not present

## 2018-08-28 DIAGNOSIS — Z13220 Encounter for screening for lipoid disorders: Secondary | ICD-10-CM | POA: Diagnosis not present

## 2018-09-01 DIAGNOSIS — G4733 Obstructive sleep apnea (adult) (pediatric): Secondary | ICD-10-CM | POA: Diagnosis not present

## 2018-10-02 DIAGNOSIS — G4733 Obstructive sleep apnea (adult) (pediatric): Secondary | ICD-10-CM | POA: Diagnosis not present

## 2018-10-10 DIAGNOSIS — Z1211 Encounter for screening for malignant neoplasm of colon: Secondary | ICD-10-CM | POA: Diagnosis not present

## 2018-10-25 ENCOUNTER — Telehealth: Payer: Self-pay | Admitting: Cardiology

## 2018-10-25 NOTE — Telephone Encounter (Signed)
Spoke with patient who confirmed all demographics. Patient uses My Chart and has a smart phone.  Will have vitals ready for visit. °

## 2018-10-26 ENCOUNTER — Telehealth: Payer: Self-pay | Admitting: Cardiology

## 2018-10-26 NOTE — Telephone Encounter (Signed)
Spoke with patient who confirmed all demographics. Patient uses My Chart and has a smart phone. Will have vitals ready for visit. He said that he may not get home until 3pm since he has to drive 30 from work.

## 2018-10-27 NOTE — Progress Notes (Signed)
Virtual Visit via Video Note   This visit type was conducted due to national recommendations for restrictions regarding the COVID-19 Pandemic (e.g. social distancing) in an effort to limit this patient's exposure and mitigate transmission in our community.  Due to his co-morbid illnesses, this patient is at least at moderate risk for complications without adequate follow up.  This format is felt to be most appropriate for this patient at this time.  All issues noted in this document were discussed and addressed.  A limited physical exam was performed with this format.  Please refer to the patient's chart for his consent to telehealth for [facility].   Date:  10/30/2018   ID:  Kyle Cross, DOB September 01, 1965, MRN [id number]  Patient Location: Home Provider Location: Home  PCP:  Kyle Heys, MD  Cardiologist:  Kyle Noe, MD  Electrophysiologist:  None   Evaluation Performed:  Follow-Up Visit  Chief Complaint: Follow-up, seen for Kyle Cross  History of Present Illness:    Kyle Cross is a 53 y.o. male with a prior history of hypertension, OSA on CPAP and hypertensive cardiovascular disease (LVH and LAE on echocardiogram) however no prior documented CAD.  He was last seen in the office for follow-up 10/18/2017. Of note, in 08/2013 he underwent an echocardiogram which showed concentric left ventricular hypertrophy, LVEF at 60 to 65% with normal wall motion and G1 DD as well as severe left atrial enlargement.  04/2014 he underwent a cardiac cath in the setting of chest pain and EKG abnormalities. This revealed widely patent coronary arteries without evidence of significant CAD, apical hypertrophic cardiomyopathy and elevated LVEDP compatible with diastolic heart failure. He was placed on PO Cardizem and discharged home with plans for cardiac MRI to further evaluate for HOCM.  He initially failed to get MRI completed. He was seen back in 2017 and noted to have worsening exertional  dyspnea and chest pain.  ASA was added as well as metoprolol 25 mg twice daily.  MRI was again ordered and was performed 12/19/2015.  The study was terminated early as the patient felt severe claustrophobia. No post gallium images were required.  This demonstrated normal LV size with moderate concentric hypertrophy and normal systolic function with an LVEF estimated at 61% with no regional wall motion abnormalities.  He also had an NST around the same time which was negative for ischemia.  Test results were routed to Kyle Cross for review and he did not feel that the patient would require further cardiac work-up.  08/2017 patient presented with complaints of chronic fatigue and sexual dysfunction which were very bothersome and were noted to be affecting his quality of life. After medication review, symptoms were thought to be related to chronic beta blocker therapy. Case was discussed with DOD and previous echocardiogram was reviewed which showed prior concern for HOCM however, MRI was reassuring.  It was then decided to discontinue metoprolol as there were no other etiologies to explain his fatigue.  Labs were obtained including TSH, BMP and CBC, all of which were unremarkable.    He was then seen approximately 1 month later 10/18/2017 for follow-up in which he had significant improvement in his symptoms including less fatigue and more energy. He had improvement in sexual dysfunction. His heart rate and blood pressure remained stable off of the metoprolol.  He was taking his blood pressure regularly at home with systolic blood pressures under 140 and diastolic blood pressures in the 70-80 range.  Today  Kyle Cross is seen via telephone visit.  He is doing well from a cardiac perspective.  He has a prior history of OSA on CPAP however has had issues in the past with chronic fatigue.  He was previously taken off of his beta-blocker as this was causing more fatigue as well as erectile dysfunction. He was recently  reevaluated by a sleep specialist, Dr. Allie Cross at the beginning of the year and was diagnosed with atypical narcolepsy and was placed on armodafinil.  He states this medication has helped with his fatigue however he has noticed approximately once per day he will have asymptomatic tachycardia detected by his watch.  He does not have dizziness, palpitations, shortness of breath or chest pain.  He denies abnormal weight gain, PND, orthopnea symptoms or syncope.  He states it only last a few minutes and then will dissipate on its own.  In looking over the side effects of his medicine, this appears to be common.  He states that he only takes the medicine Monday through Friday as it does make him feel moderately anxious (also a side effect) but he is less fatigued.  Then he takes a break Saturday and Sunday to let his body relax more.  His BP is well controlled today at 119/79.  Heart rate is 71 today.  Continues with yard chores and working without complication.  His labs were performed by his primary care doctor in March with no significant abnormalities.  Overall he is doing well.  The patient does not have symptoms concerning for COVID-19 infection (fever, chills, cough, or new shortness of breath).   Past Medical History:  Diagnosis Date  . Dyspnea   . Fatigue   . GERD (gastroesophageal reflux disease)   . HTN (hypertension)   . Hypertensive cardiovascular disease    a. LVH, LAE noted on ECG  . Hypertrophic cardiomyopathy (HCC)    a. noted on LHC on 04/06/14.   Marland Kitchen Neuropathy 2012   felt to be infectious (viral)  . OSA on CPAP    Past Surgical History:  Procedure Laterality Date  . CARDIAC CATHETERIZATION  05/07/2014  . LEFT HEART CATHETERIZATION WITH CORONARY ANGIOGRAM N/A 05/07/2014   Procedure: LEFT HEART CATHETERIZATION WITH CORONARY ANGIOGRAM;  Surgeon: Kyle Noe, MD;  Location: [facility] CATH LAB;  Service: Cardiovascular;  Laterality: N/A;  . SCROTAL EXPLORATION  Oct 2012   foliculitis   . VASECTOMY  02/1998    Current Meds  Medication Sig  . Armodafinil 150 MG tablet Take 150 mg by mouth daily.  Marland Kitchen aspirin EC 81 MG tablet Take 81 mg by mouth daily.  Marland Kitchen diltiazem (CARDIZEM CD) 240 MG 24 hr capsule Take 1 capsule (240 mg total) by mouth daily.  . sildenafil (REVATIO) 20 MG tablet Take 2-5 tablets by mouth as directed. 30-60 mins before sex     Allergies:   Shrimp [shellfish allergy] and Metoprolol tartrate   Social History   Tobacco Use  . Smoking status: Never Smoker  . Smokeless tobacco: Never Used  Substance Use Topics  . Alcohol use: Yes    Alcohol/week: 1.0 standard drinks    Types: 1 Glasses of wine per week  . Drug use: No    Family Hx: The patient's family history includes Diabetes in his father; Hypertension in his mother. There is no history of Heart attack.  ROS:   Please see the history of present illness.     All other systems reviewed and are negative.  Prior  CV studies:   The following studies were reviewed today:  Cardiac MRI 12/19/2015: FINDINGS: IMPRESSION: The study was terminated early as the patient developed severe claustrophobia, no post gadolinium images were acquired.  1. Normal left ventricular size, with moderate concentric hypertrophy and normal systolic function (LVEF = 61%) with no regional wall motion abnormalities.  No systolic anterior motion was seen. No late gadolinium images were acquired, however this appears as a hypertensive heart disease rather than hypertrophic cardiomyopathy.  2. Normal right ventricular size, thickness and systolic function (RVEF = 50%) with no regional wall motion abnormalities.  3.  Mild mitral and trivial tricuspid regurgitation.  4. Mildly dilated pulmonary artery.   Exercise stress test 01/28/2016:  Nuclear stress EF: 49%.  Blood pressure demonstrated a normal response to exercise.  There was no ST segment deviation noted during stress.  This is a low risk study.  The  left ventricular ejection fraction is mildly decreased (45-54%).   Normal perfusion images EF 49% Surface images suggest LVE with LVH vs HOCM Suggest cardiac MRI to further assess Baseline ECG with marked repolarization abnormalities    Echocardiogram 09/10/2013: Study Conclusions  - Left ventricle: The cavity size was normal. There was moderate concentric hypertrophy. Systolic function was normal. The estimated ejection fraction was in the range of 60% to 65%. Wall motion was normal; there were no regional wall motion abnormalities. Doppler parameters are consistent with abnormal left ventricular relaxation (grade 1 diastolic dysfunction). The E/e' ratio is >10, suggesting elevated LV filling pressure. - Left atrium: Severely dilated (>40 ml/m2), - Inferior vena cava: The vessel was normal in size; the respirophasic diameter changes were in the normal range (= 50%); findings are consistent with normal central venous pressure. - Pericardium, extracardiac: There was no pericardial effusion. Transthoracic echocardiography. M-mode, complete 2D, spectral Doppler, and color Doppler. Height: Height: 170.2cm. Height: 67in. Weight: Weight: 123.8kg. Weight: 272.4lb. Body mass index: BMI: 42.8kg/m^2. Body surface area:  BSA: 2.10439m^2. Blood pressure:   144/94. Patient status: Outpatient. [facility]  Labs/Other Tests and Data Reviewed:    EKG:  09/20/2017 normal sinus rhythm with marked T wave abnormality throughout. When compared to prior tracing from 2017>>similar in appearance    Recent Labs: No results found for requested labs within last 8760 hours.   Recent Lipid Panel Lab Results  Component Value Date/Time   CHOL 160 05/07/2014 03:07 PM   TRIG 88 05/07/2014 03:07 PM   HDL 48 05/07/2014 03:07 PM   CHOLHDL 3.3 05/07/2014 03:07 PM   LDLCALC 94 05/07/2014 03:07 PM   Wt Readings from Last 3 Encounters:  10/30/18 280 lb (127  kg)  05/15/18 290 lb (131.5 kg)  05/14/18 290 lb (131.5 kg)    Objective:    Vital Signs:  BP 119/79   Pulse 71   Ht 5' 8.5" (1.74 m)   Wt 280 lb (127 kg)   BMI 41.95 kg/m    VITAL SIGNS:  reviewed GEN:  no acute distress EYES:  sclerae anicteric, EOMI - Extraocular Movements Intact RESPIRATORY:  normal respiratory effort, symmetric expansion CARDIOVASCULAR:  no peripheral edema SKIN:  no rash, lesions or ulcers. MUSCULOSKELETAL:  no obvious deformities. NEURO:  alert and oriented x 3, no obvious focal deficit PSYCH:  normal affect  ASSESSMENT & PLAN:    1.  Chronic fatigue related to OSA/narcolepsy: -Was previously followed to be related to beta-blocker therapy which was discontinued 08/2017 with significant improvement -Although improved from his last office visit, continued  to have chronic fatigue.  Was seen by sleep specialist at the beginning of the year and diagnosed with atypical narcolepsy and placed on armodafinil.  He states his medication has helped and is to follow-up with his sleep specialist soon -Has noticed asymptomatic tachycardia, detected on his watch thought to be related to this medication -Plan is for him to monitor closely and if increased frequency or if he becomes symptomatic he is to contact our office for further evaluation>> could consider increasing diltiazem to 360 mg if BP allows given that he is intolerant to beta-blocker therapy  3.  OSA: -Stable, reports full compliance with CPAP -Follows with PCP/sleep specialist, Dr. Allie Cross for this -See above  4.  Hypertension: -Well-controlled on Cardizem 240 -BP, 119/79>>HR 71 -If he continues to have asymptomatic tachycardia related to armodafinil>> could consider increasing Cardizem to 360 mg given that he is intolerant to beta-blockers   COVID-19 Education: The signs and symptoms of COVID-19 were discussed with the patient and how to seek care for testing (follow up with PCP or arrange E-visit).  The  importance of social distancing was discussed today.  Time:   Today, I have spent 25 minutes with the patient with telehealth technology discussing the above problems.     Medication Adjustments/Labs and Tests Ordered: Current medicines are reviewed at length with the patient today.  Concerns regarding medicines are outlined above.   Tests Ordered: No orders of the defined types were placed in this encounter.   Medication Changes: No orders of the defined types were placed in this encounter.   Disposition:  Follow up Robbie Lis, PA in 6 months  Signed, Georgie Chard, NP  10/30/2018 3:46 PM    [facility]

## 2018-10-30 ENCOUNTER — Telehealth (INDEPENDENT_AMBULATORY_CARE_PROVIDER_SITE_OTHER): Payer: 59 | Admitting: Cardiology

## 2018-10-30 ENCOUNTER — Other Ambulatory Visit: Payer: Self-pay

## 2018-10-30 ENCOUNTER — Encounter: Payer: Self-pay | Admitting: Cardiology

## 2018-10-30 VITALS — BP 119/79 | HR 71 | Ht 68.5 in | Wt 280.0 lb

## 2018-10-30 DIAGNOSIS — I10 Essential (primary) hypertension: Secondary | ICD-10-CM

## 2018-10-30 DIAGNOSIS — R5383 Other fatigue: Secondary | ICD-10-CM

## 2018-10-30 DIAGNOSIS — G4733 Obstructive sleep apnea (adult) (pediatric): Secondary | ICD-10-CM

## 2018-10-30 DIAGNOSIS — Z9989 Dependence on other enabling machines and devices: Secondary | ICD-10-CM

## 2018-10-30 NOTE — Patient Instructions (Signed)
Medication Instructions:  Your physician recommends that you continue on your current medications as directed. Please refer to the Current Medication list given to you today.  If you need a refill on your cardiac medications before your next appointment, please call your pharmacy.   Lab work: None ordered  If you have labs (blood work) drawn today and your tests are completely normal, you will receive your results only by: Marland Kitchen MyChart Message (if you have MyChart) OR . A paper copy in the mail If you have any lab test that is abnormal or we need to change your treatment, we will call you to review the results.  Testing/Procedures: None ordered   Follow-Up: At [facility], you and your health needs are our priority.  As part of our continuing mission to provide you with exceptional heart care, we have created designated Provider Care Teams.  These Care Teams include your primary Cardiologist (physician) and Advanced Practice Providers (APPs -  Physician Assistants and Nurse Practitioners) who all work together to provide you with the care you need, when you need it. You will need a follow up appointment in 6 months.  Please call our office 2 months in advance to schedule this appointment.  You may see Boyce Medici, PA-C following Advanced Practice Providers on your designated Care Team:    Any Other Special Instructions Will Be Listed Below (If Applicable).

## 2018-11-01 DIAGNOSIS — G4733 Obstructive sleep apnea (adult) (pediatric): Secondary | ICD-10-CM | POA: Diagnosis not present

## 2018-11-07 DIAGNOSIS — G4712 Idiopathic hypersomnia without long sleep time: Secondary | ICD-10-CM | POA: Diagnosis not present

## 2018-11-07 DIAGNOSIS — G4733 Obstructive sleep apnea (adult) (pediatric): Secondary | ICD-10-CM | POA: Diagnosis not present

## 2019-05-08 NOTE — Progress Notes (Signed)
CARDIOLOGY OFFICE NOTE  Date:  05/09/2019    Kyle Cross Date of Birth: [birth year] Medical Record #[id number]  PCP:  Blair HeysEhinger, Robert, MD  Cardiologist:  Katrinka BlazingSmith    Chief Complaint  Patient presents with   Follow-up    Seen for Dr. Katrinka BlazingSmith    History of Present Illness: Kyle FriarMicheal T Cross is a 53 y.o. male who presents today for a 6 month check. Seen for Dr. Katrinka BlazingSmith but has primarily seen APPs over the last several years.   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 He has a chronically abnormal EKG.   He has had prior echo with concentric LVH and normal EF. Prior cath in 2015 showed normal coronaries, apical hypertrophic Cm and elevated LVEDP compatible with diastolic HF. He was to have cardiac MRI in 2015 but failed to complete until 2017 but terminated early due to severe claustrophobia.  No post gallium images were required. The study demonstrated normal LV size with moderate concentric hypertrophy and normal systolic function with an LVEF estimated at 61% with no regional wall motion abnormalities. Overall, study reassuring and further work up not felt to be needed.   He had a telehealth visit with Noreene LarssonJill back in May - noted the new diagnosis of atypical narcolepsy and that he is now on armodafinil - he takes 5 days a week, not 7 to "let his body rest". This has caused some noted tachycardia but without actual clinical symptoms.   The patient does not have symptoms concerning for COVID-19 infection (fever, chills, cough, or new shortness of breath).   Comes in today. Here alone. We called him yesterday to arrange this visit. He notes that he has not felt well for about the last 3 months. He has had chest pressure in the middle of his chest and in the left side of his neck - seems exertion related. He can have with walking - does not  seem to have just at rest. He feels it is getting worse and more frequent. It can last for several hours and gets better with just trying to relax and rest. No real other associated symptoms. He says he is not really short of breath. He feels more "stressed" with this Armodafinil that he takes - still taking just 5 days a week. He still has this chest pressure on the weekends even without taking. He has to be on this agent - he is trying to keep his job - still feeling pretty sleepy even with Armodafinil. EKG is chronically abnormal. He notes he feels a pressure sensation in his chest during our visit. Little vague to figure out if this is how he felt 5 years ago.    Past Medical History:  Diagnosis Date   Dyspnea    Fatigue    GERD (gastroesophageal reflux disease)    HTN (hypertension)    Hypertensive cardiovascular disease    a. LVH, LAE noted on ECG   Hypertrophic cardiomyopathy (HCC)    a. noted on LHC on 04/06/14.    Neuropathy 2012   felt to be infectious (viral)   OSA on CPAP     Past Surgical History:  Procedure Laterality Date   CARDIAC CATHETERIZATION  05/07/2014   LEFT HEART CATHETERIZATION WITH CORONARY ANGIOGRAM N/A 05/07/2014   Procedure: LEFT HEART CATHETERIZATION WITH CORONARY ANGIOGRAM;  Surgeon: Barry DienesHenry W  Leia Alf, MD;  Location: [facility] CATH LAB;  Service: Cardiovascular;  Laterality: N/A;   SCROTAL EXPLORATION  Oct 2012   foliculitis   VASECTOMY  02/1998     Medications: Current Meds  Medication Sig   Armodafinil 50 MG tablet Take 100 mg by mouth daily.   aspirin EC 81 MG tablet Take 81 mg by mouth daily.   diltiazem (CARDIZEM CD) 240 MG 24 hr capsule Take 1 capsule (240 mg total) by mouth daily.   Multiple Vitamin (MULTIVITAMIN WITH MINERALS) TABS tablet Take 1 tablet by mouth daily.   sildenafil (REVATIO) 20 MG tablet Take 2-5 tablets by mouth as directed. 30-60 mins before sex   [DISCONTINUED] Armodafinil 150 MG tablet Take 150 mg by mouth daily.      Allergies: Allergies  Allergen Reactions   Shrimp [Shellfish Allergy] Anaphylaxis   Metoprolol Tartrate Other (See Comments)    Fatigue and erectile dysfunction     Social History: The patient  reports that he has never smoked. He has never used smokeless tobacco. He reports current alcohol use of about 1.0 standard drinks of alcohol per week. He reports that he does not use drugs.   Family History: The patient's family history includes Diabetes in his father; Hypertension in his mother.   Review of Systems: Please see the history of present illness.   All other systems are reviewed and negative.   Physical Exam: VS:  BP 134/84    Pulse 66    Ht 5\' 8"  (1.727 m)    Wt 281 lb 13 oz (127.8 kg)    SpO2 96%    BMI 42.85 kg/m  .  BMI Body mass index is 42.85 kg/m.  Wt Readings from Last 3 Encounters:  05/09/19 281 lb 13 oz (127.8 kg)  10/30/18 280 lb (127 kg)  05/15/18 290 lb (131.5 kg)    General: Pleasant. Obese and in no acute distress.   HEENT: Normal.  Neck: Supple, no JVD, carotid bruits, or masses noted.  Cardiac: Regular rate and rhythm. No murmurs, rubs, or gallops. No edema.  Respiratory:  Lungs are clear to auscultation bilaterally with normal work of breathing.  GI: Soft and nontender.  MS: No deformity or atrophy. Gait and ROM intact.  Skin: Warm and dry. Color is normal.  Neuro:  Strength and sensation are intact and no gross focal deficits noted.  Psych: Alert, appropriate and with normal affect.   LABORATORY DATA:  EKG:  EKG is ordered today. This demonstrates NSR with marked ST and T wave changes. Reviewed with Dr. 05/17/18 here in the office today.   Lab Results  Component Value Date   WBC 5.7 09/20/2017   HGB 13.8 09/20/2017   HCT 41.5 09/20/2017   PLT 244 09/20/2017   GLUCOSE 101 (H) 09/20/2017   CHOL 160 05/07/2014   TRIG 88 05/07/2014   HDL 48 05/07/2014   LDLCALC 94 05/07/2014   ALT 21 05/07/2014   AST 19 05/07/2014   NA 142 09/20/2017     K 3.8 09/20/2017   CL 102 09/20/2017   CREATININE 0.84 09/20/2017   BUN 13 09/20/2017   CO2 25 09/20/2017   TSH 0.840 09/20/2017   PSA 0.62 Test Methodology: [employer] PSA 10/23/2010   INR 1.01 05/07/2014   HGBA1C 5.9 (H) 09/20/2017       BNP (last 3 results) No results for input(s): BNP in the last 8760 hours.  ProBNP (last 3 results) No results for input(s): PROBNP in the last  8760 hours.   Other Studies Reviewed Today:  Cardiac MRI 12/19/2015: FINDINGS: IMPRESSION: The study was terminated early as the patient developed severe claustrophobia, no post gadolinium images were acquired.  1. Normal left ventricular size, with moderate concentric hypertrophy and normal systolic function (LVEF = 61%) with no regional wall motion abnormalities.  No systolic anterior motion was seen. No late gadolinium images were acquired, however this appears as a hypertensive heart disease rather than hypertrophic cardiomyopathy.  2. Normal right ventricular size, thickness and systolic function (RVEF = 50%) with no regional wall motion abnormalities.  3. Mild mitral and trivial tricuspid regurgitation.  4. Mildly dilated pulmonary artery.   Exercise stress test 01/28/2016:  Nuclear stress EF: 49%.  Blood pressure demonstrated a normal response to exercise.  There was no ST segment deviation noted during stress.  This is a low risk study.  The left ventricular ejection fraction is mildly decreased (45-54%).  Normal perfusion images EF 49% Surface images suggest LVE with LVH vs HOCM Suggest cardiac MRI to further assess Baseline ECG with marked repolarization abnormalities    Echocardiogram 09/10/2013: Study Conclusions  - Left ventricle: The cavity size was normal. There was moderate concentric hypertrophy. Systolic function was normal. The estimated ejection fraction was in the range of 60% to 65%. Wall motion was normal; there were no regional wall  motion abnormalities. Doppler parameters are consistent with abnormal left ventricular relaxation (grade 1 diastolic dysfunction). The E/e' ratio is >10, suggesting elevated LV filling pressure. - Left atrium: Severely dilated (>40 ml/m2), - Inferior vena cava: The vessel was normal in size; the respirophasic diameter changes were in the normal range (= 50%); findings are consistent with normal central venous pressure. - Pericardium, extracardiac: There was no pericardial effusion.   ANGIOGRAPHIC DATA 04/2014:    The left main coronary artery is normal and widely patent.  The left anterior descending artery is widely patent and tortuous. It wraps around the left ventricular apex.  The ramus intermedius is a large trifurcating vessel that is free of obstruction  The left circumflex artery is gives origin to 3 obtuse marginal branches that are relatively small and all widely patent..  The right coronary artery is nondominant and normal.  LEFT VENTRICULOGRAM:  Left ventricular angiogram was done in the 30 RAO projection and revealed a spade-shaped LV cavity. EF is 70%.   IMPRESSIONS:  1. Widely patent coronary arteries without evidence of significant coronary atherosclerosis. 2. Apical hypertrophic cardiomyopathy 3. LVEF 70% with elevated end-diastolic pressure compatible with diastolic heart failure   RECOMMENDATION:  Optimize therapy for hypertrophic cardiomyopathy. Cycle cardiac markers Consider other sources of chest discomfort if markers are negative..  Assessment/Plan:  1. Chest pain - chronically abnormal EKG - seems to be more exertional related but lasting for long spells - no new worrisome CV risk factors noted - he does not smoke - only with borderline DM - discussed at length with Dr. Tamala Julian and reviewed prior studies - his cath from 5 years were ago showed widely patent coronaries - we will plan to update his echo and then decide about further  evaluation.  There is concern for possible microvascular angina - he is already on CCB and unfortunately is intolerant to beta blocker. Further disposition to follow.   2. HTN - he is intolerant to beta blocker therapy - his BP looks good today on CCB - we will get his echo updated to look for worsening LVH.   3. Chronic fatigue - most likely  multifactorial - off beta blocker - now with narcolepsy diagnosis - challenging situation.   4. Narcolepsy - per Dr. Earl Gala.   5. Obesity - needs risk factor modification - this has seemed to be a challenge chronically.   6. COVID-19 Education: The signs and symptoms of COVID-19 were discussed with the patient and how to seek care for testing (follow up with PCP or arrange E-visit).  The importance of social distancing, staying at home, hand hygiene and wearing a mask when out in public were discussed today.  Current medicines are reviewed with the patient today.  The patient does not have concerns regarding medicines other than what has been noted above.  The following changes have been made:  See above.  Labs/ tests ordered today include:    Orders Placed This Encounter  Procedures   EKG 12-Lead   ECHOCARDIOGRAM COMPLETE     Disposition:   FU with [place] after echo complete.    Patient is agreeable to this plan and will call if any problems develop in the interim.   SignedNorma Fredrickson, NP  05/09/2019 12:19 PM  [facility] [address] [place], [place]  [address] Phone: [phone] Fax: [phone]

## 2019-05-09 ENCOUNTER — Other Ambulatory Visit: Payer: Self-pay

## 2019-05-09 ENCOUNTER — Encounter: Payer: Self-pay | Admitting: Nurse Practitioner

## 2019-05-09 ENCOUNTER — Ambulatory Visit (INDEPENDENT_AMBULATORY_CARE_PROVIDER_SITE_OTHER): Payer: 59 | Admitting: Nurse Practitioner

## 2019-05-09 VITALS — BP 134/84 | HR 66 | Ht 68.0 in | Wt 281.8 lb

## 2019-05-09 DIAGNOSIS — G47419 Narcolepsy without cataplexy: Secondary | ICD-10-CM | POA: Diagnosis not present

## 2019-05-09 DIAGNOSIS — I10 Essential (primary) hypertension: Secondary | ICD-10-CM | POA: Diagnosis not present

## 2019-05-09 DIAGNOSIS — R5383 Other fatigue: Secondary | ICD-10-CM | POA: Diagnosis not present

## 2019-05-09 DIAGNOSIS — R079 Chest pain, unspecified: Secondary | ICD-10-CM | POA: Diagnosis not present

## 2019-05-09 NOTE — Patient Instructions (Addendum)
After Visit Summary:  We will be checking the following labs today - NONE   Medication Instructions:    Continue with your current medicines.    If you need a refill on your cardiac medications before your next appointment, please call your pharmacy.     Testing/Procedures To Be Arranged:  Echocardiogram  Follow-Up:   See Dr. Tamala Julian in follow up after echocardiogram    At [facility], you and your health needs are our priority.  As part of our continuing mission to provide you with exceptional heart care, we have created designated Provider Care Teams.  These Care Teams include your primary Cardiologist (physician) and Advanced Practice Providers (APPs -  Physician Assistants and Nurse Practitioners) who all work together to provide you with the care you need, when you need it.  Special Instructions:  . Stay safe, stay home, wash your hands for at least 20 seconds and wear a mask when out in public.  . It was good to talk with you today.    Call the [place] office at [phone] if you have any questions, problems or concerns.

## 2019-05-18 ENCOUNTER — Other Ambulatory Visit: Payer: Self-pay

## 2019-05-18 ENCOUNTER — Ambulatory Visit (HOSPITAL_COMMUNITY): Payer: 59 | Attending: Cardiovascular Disease

## 2019-05-18 DIAGNOSIS — R5383 Other fatigue: Secondary | ICD-10-CM | POA: Insufficient documentation

## 2019-05-18 DIAGNOSIS — R079 Chest pain, unspecified: Secondary | ICD-10-CM

## 2019-05-18 DIAGNOSIS — I10 Essential (primary) hypertension: Secondary | ICD-10-CM | POA: Diagnosis not present

## 2019-05-18 MED ORDER — PERFLUTREN LIPID MICROSPHERE
1.0000 mL | INTRAVENOUS | Status: AC | PRN
  Administered 2019-05-18: 2 mL via INTRAVENOUS

## 2019-05-22 ENCOUNTER — Telehealth: Payer: Self-pay | Admitting: Interventional Cardiology

## 2019-05-22 NOTE — Telephone Encounter (Signed)
Patient returning call in regards to his echo results.

## 2019-05-22 NOTE — Telephone Encounter (Signed)
Notes recorded by Burtis Junes, NP on 05/19/2019 at 10:26 AM EST  Ok to report. Echo shows that he has severe thickness of the heart muscle as well as significant stiffness. Overall pumping function is normal - tis is good. Valves are basically ok.  He needs to keep his follow up with Dr. Tamala Julian for further discussion.       The patient has been notified of the result, as mentioned above, and verbalized understanding.  All questions (if any) were answered. Nuala Alpha, LPN [id number]/54/5625 63:89 AM

## 2019-05-22 NOTE — Telephone Encounter (Signed)
Left the pt a message to call the office back and request to speak with any triage nurse, to receive echo results per Truitt Merle NP.

## 2019-05-27 NOTE — Progress Notes (Signed)
Cardiology Office Note:    Date:  05/28/2019   ID:  Kyle Cross, DOB May 18, 1966, MRN [id number]  PCP:  Blair HeysEhinger, Robert, MD  Cardiologist:  Lesleigh NoeHenry W  III, MD   Referring MD: Blair HeysEhinger, Robert, MD   Chief Complaint  Patient presents with  . Advice Only    Cardiomyopathy/hypertrophic    History of Present Illness:    Kyle Cross is a [age] y.o. 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 He has a chronically abnormal EKG.   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 Has increase in LV thickness by echo without evidence of infiltration or hypertrophic CM by MRI in 2017. Diagnosed with OSA and atypical narcolepsy. Started on Armodafinil and noticed increase in chest discomfort.  He denies orthopnea, PND, and palpitations. Has waxing and wanninig LE edema.  Past Medical History:  Diagnosis Date  . Dyspnea   . Fatigue   . GERD (gastroesophageal reflux disease)   . HTN (hypertension)   . Hypertensive cardiovascular disease    a. LVH, LAE noted on ECG  . Hypertrophic cardiomyopathy (HCC)    a. noted on LHC on 04/06/14.   Marland Kitchen. 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 OSA on CPAP     Past Surgical History:  Procedure Laterality Date  . CARDIAC CATHETERIZATION  05/07/2014  . LEFT HEART CATHETERIZATION WITH CORONARY ANGIOGRAM N/A 05/07/2014   Procedure: LEFT HEART CATHETERIZATION WITH CORONARY ANGIOGRAM;  Surgeon: Lesleigh NoeHenry W  III, MD;  Location[facility] C[facility] LAB;  Service: Cardiovascular;  Laterality: N/A;  . SCROTAL EXPLORATION  Oct 2012   foliculitis  . VASECTOMY  02/1998    Current Medications: Current Meds  Medication Sig  . Armodafinil 50 MG tablet Take 100 mg by mouth daily.  Marland Kitchen. aspirin EC 81 MG tablet Take 81 mg by mouth daily.  Marland Kitchen. diltiazem (CARDIZEM CD) 240 MG  24 hr capsule Take 1 capsule (240 mg total) by mouth daily.  . Multiple Vitamin (MULTIVITAMIN WITH MINERALS) TABS tablet Take 1 tablet by mouth daily.  . sildenafil (REVATIO) 20 MG tablet Take 2-5 tablets by mouth as directed. 30-60 mins before sex     Allergies:   Shrimp [shellfish allergy] and Metoprolol tartrate   Social History   Socioeconomic History  . Marital status: Married    Spouse name: Not on file  . Number of children: Not on file  . Years of education: Not on file  . Highest education level: Not on file  Occupational History  . Not on file  Social Needs  . Financial resource strain: Not on file  . Food insecurity    Worry: Not on file    Inability: Not on file  . Transportation needs    Medical: Not on file    Non-medical: Not on file  Tobacco Use  . Smoking status: Never Smoker  . Smokeless tobacco: Never Used  Substance and Sexual Activity  . Alcohol use: Yes    Alcohol/week: 1.0 standard drinks    Types: 1 Glasses of wine per week  . Drug use: No  . Sexual activity: Yes  Lifestyle  . Physical activity    Days per week: Not on file    Minutes per session: Not on file  . Stress:  Not on file  Relationships  . Social Musician on phone: Not on file    Gets together: Not on file    Attends religious service: Not on file    Active member of club or organization: Not on file    Attends meetings of clubs or organizations: Not on file    Relationship status: Not on file  Other Topics Concern  . Not on file  Social History Narrative  . Not on file     Family History: The patient's family history includes Diabetes in his father; Hypertension in his mother. There is no history of Heart attack.  ROS:   Please see the history of present illness.    Concerned about his mortality, erectile dysfunction, Sildenafil causes nasal congestion All other systems reviewed and are negative.  EKGs/Labs/Other Studies Reviewed:    The following studies  were reviewed today: ECHOCARDIOGRAM 2020 IMPRESSIONS    1. Left ventricular ejection fraction, by visual estimation, is 55 to 60%. The left ventricle has normal function. There is severely increased left ventricular hypertrophy.  2. Left ventricular diastolic parameters are consistent with Grade II diastolic dysfunction (pseudonormalization).  3. Global right ventricle has normal systolic function.The right ventricular size is normal. No increase in right ventricular wall thickness.  4. Left atrial size was normal.  5. Right atrial size was normal.  6. The pericardial effusion is posterior to the left ventricle.  7. Trivial pericardial effusion is present.  8. The mitral valve is normal in structure. Trace mitral valve regurgitation. No evidence of mitral stenosis.  9. The tricuspid valve is normal in structure. Tricuspid valve regurgitation is not demonstrated. 10. The aortic valve is tricuspid. Aortic valve regurgitation is not visualized. Mild aortic valve sclerosis without stenosis. 11. The pulmonic valve was normal in structure. Pulmonic valve regurgitation is not visualized. 12. The inferior vena cava is normal in size with greater than 50% respiratory variability, suggesting right atrial pressure of 3 mmHg.  LV free wall diameter: 2.3 cm (2020); 1.48 cm (2017) IVS diameter: 1.7 cm (2020); 1.77 cm (2017)  EKG:  EKG is not performed today.05/09/2019 demonstrated IVCD, marked repolarization abnormality, and NSR. No change c/w prior.  Recent Labs: No results found for requested labs within last 8760 hours.  Recent Lipid Panel    Component Value Date/Time   CHOL 160 05/07/2014 1507   TRIG 88 05/07/2014 1507   HDL 48 05/07/2014 1507   CHOLHDL 3.3 05/07/2014 1507   VLDL 18 05/07/2014 1507   LDLCALC 94 05/07/2014 1507    Physical Exam:    VS:  BP (!) 142/80   Pulse 73   Ht 5\' 8"  (1.727 m)   Wt 284 lb 12.8 oz (129.2 kg)   SpO2 97%   BMI 43.30 kg/m     Wt Readings from  Last 3 Encounters:  05/28/19 284 lb 12.8 oz (129.2 kg)  05/09/19 281 lb 13 oz (127.8 kg)  10/30/18 280 lb (127 kg)     GEN: Obese. No acute distress HEENT: Normal NECK: No JVD. LYMPHATICS: No lymphadenopathy CARDIAC:  RRR without murmur, gallop, or edema. VASCULAR:  Normal Pulses. No bruits. RESPIRATORY:  Clear to auscultation without rales, wheezing or rhonchi  ABDOMEN: Soft, non-tender, non-distended, No pulsatile mass, MUSCULOSKELETAL: No deformity  SKIN: Warm and dry NEUROLOGIC:  Alert and oriented x 3 PSYCHIATRIC:  Normal affect   ASSESSMENT:    1. Chest pain of uncertain etiology   2. Dyspnea, unspecified type  3. LVH (left ventricular hypertrophy)   4. Hypertension, essential   5. OSA on CPAP   6. Educated about COVID-19 virus infection    PLAN:    In order of problems listed above:  1. Chest pressure with exertion likely related to coronary microvascular dysfunction.  Likely decreased coronary vasodilator reserve in the setting of hypertrophic left ventricle.  Likely due to diastolic HF. 2. Blood pressure is higher than it should be.  Consider adding ACE or ARB with low-dose diuretic therapy in addition to calcium channel blocker therapy. 3. We have excluded the possibility of an infiltrative and hypertrophic cardiomyopathy although on MRI done in 2017. 4. Control of blood pressure may need to be more aggressive. Consider angiotensin receptor blocker plus or minus HCTZ. 5. He is compliant with CPAP.  He is also taking armodafinil for narcolepsy.  He feels this keeps him awake but has more chest discomfort than when he skips the medication on weekends. 6. 3W's is understood and practiced to avoid COVID-19 infection.  Add Losartan 25 mg daily and f/u in 4-6 weeks.   Medication Adjustments/Labs and Tests Ordered: Current medicines are reviewed at length with the patient today.  Concerns regarding medicines are outlined above.  No orders of the defined types were  placed in this encounter.  No orders of the defined types were placed in this encounter.   Patient Instructions  Medication Instructions:  Your physician recommends that you continue on your current medications as directed. Please refer to the Current Medication list given to you today.  *If you need a refill on your cardiac medications before your next appointment, please call your pharmacy*  Lab Work: None If you have labs (blood work) drawn today and your tests are completely normal, you will receive your results only by: Marland Kitchen MyChart Message (if you have MyChart) OR . A paper copy in the mail If you have any lab test that is abnormal or we need to change your treatment, we will call you to review the results.  Testing/Procedures: None  Follow-Up: At [facility], you and your health needs are our priority.  As part of our continuing mission to provide you with exceptional heart care, we have created designated Provider Care Teams.  These Care Teams include your primary Cardiologist (physician) and Advanced Practice Providers (APPs -  Physician Assistants and Nurse Practitioners) who all work together to provide you with the care you need, when you need it.  Your next appointment:   12 month(s)  The format for your next appointment:   In Person  Provider:   You may see Sinclair Grooms, MD or one of the following Advanced Practice Providers on your designated Care Team:    Truitt Merle, NP  Cecilie Kicks, NP  Kathyrn Drown, NP   Other Instructions      Signed, Sinclair Grooms, MD  05/28/2019 7:06 PM    [place]

## 2019-05-28 ENCOUNTER — Telehealth: Payer: Self-pay | Admitting: Interventional Cardiology

## 2019-05-28 ENCOUNTER — Ambulatory Visit (INDEPENDENT_AMBULATORY_CARE_PROVIDER_SITE_OTHER): Payer: 59 | Admitting: Interventional Cardiology

## 2019-05-28 ENCOUNTER — Encounter: Payer: Self-pay | Admitting: Interventional Cardiology

## 2019-05-28 ENCOUNTER — Other Ambulatory Visit: Payer: Self-pay

## 2019-05-28 VITALS — BP 142/80 | HR 73 | Ht 68.0 in | Wt 284.8 lb

## 2019-05-28 DIAGNOSIS — Z9989 Dependence on other enabling machines and devices: Secondary | ICD-10-CM

## 2019-05-28 DIAGNOSIS — R079 Chest pain, unspecified: Secondary | ICD-10-CM

## 2019-05-28 DIAGNOSIS — I517 Cardiomegaly: Secondary | ICD-10-CM

## 2019-05-28 DIAGNOSIS — I10 Essential (primary) hypertension: Secondary | ICD-10-CM

## 2019-05-28 DIAGNOSIS — R0600 Dyspnea, unspecified: Secondary | ICD-10-CM

## 2019-05-28 DIAGNOSIS — G4733 Obstructive sleep apnea (adult) (pediatric): Secondary | ICD-10-CM

## 2019-05-28 DIAGNOSIS — Z7189 Other specified counseling: Secondary | ICD-10-CM

## 2019-05-28 NOTE — Telephone Encounter (Signed)
Please have him start Losartan 25 mg daily. F/U in 4-6 weeks with OV and BMET.

## 2019-05-28 NOTE — Patient Instructions (Signed)

## 2019-05-30 MED ORDER — LOSARTAN POTASSIUM 25 MG PO TABS: 25.0000 mg | ORAL_TABLET | Freq: Every day | ORAL | 3 refills | Status: DC

## 2019-05-30 NOTE — Telephone Encounter (Signed)
Spoke with pt and went over recommendations.  Scheduled pt to be seen 07/05/2019.  Pt verbalized understanding and was in agreement with this plan.

## 2019-07-03 NOTE — Progress Notes (Signed)
Cardiology Office Note:    Date:  07/05/2019   ID:  Kyle Cross, DOB Jul 09, 1965, MRN [id number]  PCP:  Gaynelle Arabian, MD  Cardiologist:  Sinclair Grooms, MD   Referring MD: Gaynelle Arabian, MD   Chief Complaint  Patient presents with  . Hypertension  . Advice Only    LVH    History of Present Illness:    Kyle Cross is a 54 y.o. male with a hx of HTN,OSApreviouslyon CPAPwhich later was diagnosed as atypical narcolepsyand hypertensive cardiovascular disease (LVH and LAE on echocardiogram) andno prior documented CAD. He has a chronically abnormal EKG.  He feels okay.  He has been having some swelling in his lower extremities.  He denies dyspnea.  No orthopnea.  Past Medical History:  Diagnosis Date  . Dyspnea   . Fatigue   . GERD (gastroesophageal reflux disease)   . HTN (hypertension)   . Hypertensive cardiovascular disease    a. LVH, LAE noted on ECG  . Hypertrophic cardiomyopathy ([place])    a. noted on LHC on 04/06/14.   Marland Kitchen Neuropathy 2012   felt to be infectious (viral)  . OSA on CPAP     Past Surgical History:  Procedure Laterality Date  . CARDIAC CATHETERIZATION  05/07/2014  . LEFT HEART CATHETERIZATION WITH CORONARY ANGIOGRAM N/A 05/07/2014   Procedure: LEFT HEART CATHETERIZATION WITH CORONARY ANGIOGRAM;  Surgeon: Sinclair Grooms, MD;  Location: [facility] CATH LAB;  Service: Cardiovascular;  Laterality: N/A;  . SCROTAL EXPLORATION  Oct 5809   foliculitis  . VASECTOMY  02/1998    Current Medications: Current Meds  Medication Sig  . Armodafinil 50 MG tablet Take 50 mg by mouth daily.   Marland Kitchen aspirin EC 81 MG tablet Take 81 mg by mouth daily.  Marland Kitchen diltiazem (CARDIZEM CD) 240 MG 24 hr capsule Take 1 capsule (240 mg total) by mouth daily.  . Multiple Vitamin (MULTIVITAMIN WITH MINERALS) TABS tablet Take 1 tablet by mouth daily.  . sildenafil (REVATIO) 20 MG tablet Take 2-5 tablets by mouth as directed. 30-60 mins before sex  . [DISCONTINUED] losartan (COZAAR) 25  MG tablet Take 1 tablet (25 mg total) by mouth daily.     Allergies:   Shrimp [shellfish allergy] and Metoprolol tartrate   Social History   Socioeconomic History  . Marital status: Married    Spouse name: Not on file  . Number of children: Not on file  . Years of education: Not on file  . Highest education level: Not on file  Occupational History  . Not on file  Tobacco Use  . Smoking status: Never Smoker  . Smokeless tobacco: Never Used  Substance and Sexual Activity  . Alcohol use: Yes    Alcohol/week: 1.0 standard drinks    Types: 1 Glasses of wine per week  . Drug use: No  . Sexual activity: Yes  Other Topics Concern  . Not on file  Social History Narrative  . Not on file   Social Determinants of Health   Financial Resource Strain:   . Difficulty of Paying Living Expenses: Not on file  Food Insecurity:   . Worried About Charity fundraiser in the Last Year: Not on file  . Ran Out of Food in the Last Year: Not on file  Transportation Needs:   . Lack of Transportation (Medical): Not on file  . Lack of Transportation (Non-Medical): Not on file  Physical Activity:   . Days of Exercise per Week:  Not on file  . Minutes of Exercise per Session: Not on file  Stress:   . Feeling of Stress : Not on file  Social Connections:   . Frequency of Communication with Friends and Family: Not on file  . Frequency of Social Gatherings with Friends and Family: Not on file  . Attends Religious Services: Not on file  . Active Member of Clubs or Organizations: Not on file  . Attends Banker Meetings: Not on file  . Marital Status: Not on file     Family History: The patient's family history includes Diabetes in his father; Hypertension in his mother. There is no history of Heart attack.  ROS:   Please see the history of present illness.    Compliant with CPAP.  Having bilateral ankle pain and soreness.  Ankles are not warm, not tender to touch.  All other systems  reviewed and are negative.  EKGs/Labs/Other Studies Reviewed:    The following studies were reviewed today: No new data  EKG:  EKG no new data  Recent Labs: No results found for requested labs within last 8760 hours.  Recent Lipid Panel    Component Value Date/Time   CHOL 160 05/07/2014 1507   TRIG 88 05/07/2014 1507   HDL 48 05/07/2014 1507   CHOLHDL 3.3 05/07/2014 1507   VLDL 18 05/07/2014 1507   LDLCALC 94 05/07/2014 1507    Physical Exam:    VS:  BP (!) 142/76   Pulse 74   Ht 5\' 8"  (1.727 m)   Wt 286 lb 6.4 oz (129.9 kg)   SpO2 97%   BMI 43.55 kg/m     Wt Readings from Last 3 Encounters:  07/05/19 286 lb 6.4 oz (129.9 kg)  05/28/19 284 lb 12.8 oz (129.2 kg)  05/09/19 281 lb 13 oz (127.8 kg)     GEN: Morbid obesity. No acute distress HEENT: Normal NECK: No JVD. LYMPHATICS: No lymphadenopathy CARDIAC: 2/6 right upper sternal border systolic murmur RRR without diastolic murmur, gallop, or edema. VASCULAR:  Normal Pulses. No bruits. RESPIRATORY:  Clear to auscultation without rales, wheezing or rhonchi  ABDOMEN: Soft, non-tender, non-distended, No pulsatile mass, MUSCULOSKELETAL: No deformity  SKIN: Warm and dry NEUROLOGIC:  Alert and oriented x 3 PSYCHIATRIC:  Normal affect   ASSESSMENT:    1. Hypertension, essential   2. LVH (left ventricular hypertrophy)   3. Chest pain of uncertain etiology   4. Uncontrolled narcolepsy   5. Erectile dysfunction, unspecified erectile dysfunction type   6. Educated about COVID-19 virus infection    PLAN:    In order of problems listed above:  1. Mild elevation in blood pressure with associated lower extremity edema.  Change losartan to losartan HCT 50/12.5 mg/day.  Bmet an office visit in 2 weeks to reassess edema and blood pressure. 2. No imaging ordered this year 3. Resolved 4. Wears CPAP 5. Not discussed 6. 3W's being practiced to avoid COVID-19  Low-salt diet, weight loss, aerobic activity  recommended.   Medication Adjustments/Labs and Tests Ordered: Current medicines are reviewed at length with the patient today.  Concerns regarding medicines are outlined above.  No orders of the defined types were placed in this encounter.  No orders of the defined types were placed in this encounter.   There are no Patient Instructions on file for this visit.   Signed, 13/11/20, MD  07/05/2019 3:50 PM    [facility]

## 2019-07-05 ENCOUNTER — Encounter: Payer: Self-pay | Admitting: Interventional Cardiology

## 2019-07-05 ENCOUNTER — Ambulatory Visit (INDEPENDENT_AMBULATORY_CARE_PROVIDER_SITE_OTHER): Payer: 59 | Admitting: Interventional Cardiology

## 2019-07-05 ENCOUNTER — Other Ambulatory Visit: Payer: Self-pay

## 2019-07-05 VITALS — BP 142/76 | HR 74 | Ht 68.0 in | Wt 286.4 lb

## 2019-07-05 DIAGNOSIS — N529 Male erectile dysfunction, unspecified: Secondary | ICD-10-CM

## 2019-07-05 DIAGNOSIS — R0789 Other chest pain: Secondary | ICD-10-CM | POA: Diagnosis not present

## 2019-07-05 DIAGNOSIS — I10 Essential (primary) hypertension: Secondary | ICD-10-CM

## 2019-07-05 DIAGNOSIS — G47419 Narcolepsy without cataplexy: Secondary | ICD-10-CM | POA: Diagnosis not present

## 2019-07-05 DIAGNOSIS — R079 Chest pain, unspecified: Secondary | ICD-10-CM

## 2019-07-05 DIAGNOSIS — I517 Cardiomegaly: Secondary | ICD-10-CM

## 2019-07-05 DIAGNOSIS — Z7189 Other specified counseling: Secondary | ICD-10-CM

## 2019-07-05 MED ORDER — LOSARTAN POTASSIUM-HCTZ 50-12.5 MG PO TABS: 1.0000 | ORAL_TABLET | Freq: Every day | ORAL | 3 refills | Status: DC

## 2019-07-05 NOTE — Patient Instructions (Signed)
Medication Instructions:  1) DISCONTINUE plain Losartan 2) START Losartan/HCTZ 50/12.5mg  once daily  *If you need a refill on your cardiac medications before your next appointment, please call your pharmacy*  Lab Work: BMET in 2 weeks  If you have labs (blood work) drawn today and your tests are completely normal, you will receive your results only by: Marland Kitchen MyChart Message (if you have MyChart) OR . A paper copy in the mail If you have any lab test that is abnormal or we need to change your treatment, we will call you to review the results.  Testing/Procedures: None  Follow-Up:  Your physician recommends that you schedule a follow-up appointment in: 2 weeks with a PA or NP.   At [facility], you and your health needs are our priority.  As part of our continuing mission to provide you with exceptional heart care, we have created designated Provider Care Teams.  These Care Teams include your primary Cardiologist (physician) and Advanced Practice Providers (APPs -  Physician Assistants and Nurse Practitioners) who all work together to provide you with the care you need, when you need it.  Your next appointment:   12 month(s)  The format for your next appointment:   In Person  Provider:   You may see Lesleigh Noe, MD or one of the following Advanced Practice Providers on your designated Care Team:    Norma Fredrickson, NP  Nada Boozer, NP  Georgie Chard, NP   Other Instructions

## 2019-07-16 NOTE — Progress Notes (Signed)
Cardiology Office Note   Date:  07/19/2019   ID:  Kyle, Cross 01/10/1966, MRN [id number]  PCP:  Gaynelle Arabian, MD  Cardiologist: Dr. Daneen Schick, MD  Chief Complaint  Patient presents with  . Follow-up     History of Present Illness: Kyle Cross is a 54 y.o. male who presents for 2-week follow-up for hypertension and edema, seen for Dr. Tamala Julian  Mr. Gilham has a history of HTN, OSA previously on CPAP>> later diagnosed with atypical narcolepsy and hypertensive cardiovascular disease with LVH and LAE on echocardiogram with no prior documented CAD.  Of note, in 08/2013 he underwent an echocardiogram which showed concentric left ventricular hypertrophy, LVEF at 60 to 65% with normal wall motion and G1 DD as well as severe left atrial enlargement. 04/2014 he underwent a cardiac cath in the setting of chest pain and EKG abnormalities. This revealed widely patent coronary arteries without evidence of significant CAD, apical hypertrophic cardiomyopathy and elevated LVEDP compatible with diastolic heart failure. He was placed on PO Cardizem and discharged home with plans for cardiac MRI to further evaluate for HOCM.  He initially failed to get MRI completed. He was seen back in 2017 and noted to have worsening exertional dyspnea and chest pain.  ASA was added as well as metoprolol 25 mg twice daily.  MRI was again ordered and was performed 12/19/2015.  The study was terminated early as the patient felt severe claustrophobia. No post gallium images were required.  This demonstrated normal LV size with moderate concentric hypertrophy and normal systolic function with an LVEF estimated at 61% with no regional wall motion abnormalities.  He also had an NST around the same time which was negative for ischemia.  Test results were routed to Dr. Tamala Julian for review and he did not feel that the patient would require further cardiac work-up.  He was last seen by Dr. Tamala Julian 07/05/2019 in follow-up and reported  some lower extremity swelling however no dyspnea orthopnea symptoms. BP in the office was 142/76.  His losartan was changed to losartan/HCTZ 50/12.5 mg with plans for close follow-up with OV and lab work.  Previous creatinine from 09/20/2017 at 0.84.  Today patient reports some symptom improvement.  States that his neck pain has improved as well as his ankle pain.  He appears euvolemic on exam today with clear lungs.  BP is more stable at 138/82.  He reports home BPs in about the same range.  No other complaints today.  Has lab work to follow this appointment.  Denies chest pain, palpitations, PND, orthopnea, LE edema, dizziness or syncope.   Past Medical History:  Diagnosis Date  . Dyspnea   . Fatigue   . GERD (gastroesophageal reflux disease)   . HTN (hypertension)   . Hypertensive cardiovascular disease    a. LVH, LAE noted on ECG  . Hypertrophic cardiomyopathy ([place])    a. noted on LHC on 04/06/14.   Marland Kitchen Neuropathy 2012   felt to be infectious (viral)  . OSA on CPAP     Past Surgical History:  Procedure Laterality Date  . CARDIAC CATHETERIZATION  05/07/2014  . LEFT HEART CATHETERIZATION WITH CORONARY ANGIOGRAM N/A 05/07/2014   Procedure: LEFT HEART CATHETERIZATION WITH CORONARY ANGIOGRAM;  Surgeon: Sinclair Grooms, MD;  Location: [facility] CATH LAB;  Service: Cardiovascular;  Laterality: N/A;  . SCROTAL EXPLORATION  Oct 2440   foliculitis  . VASECTOMY  02/1998     Current Outpatient Medications  Medication Sig Dispense Refill  . Armodafinil 50 MG tablet Take 50 mg by mouth daily.     Marland Kitchen aspirin EC 81 MG tablet Take 81 mg by mouth daily.    Marland Kitchen diltiazem (CARDIZEM CD) 240 MG 24 hr capsule Take 1 capsule (240 mg total) by mouth daily. 90 capsule 3  . losartan-hydrochlorothiazide (HYZAAR) 50-12.5 MG tablet Take 1 tablet by mouth daily. 90 tablet 3  . Multiple Vitamin (MULTIVITAMIN WITH MINERALS) TABS tablet Take 1 tablet by mouth daily.    . sildenafil (REVATIO) 20 MG tablet Take 2-5  tablets by mouth as directed. 30-60 mins before sex  12   No current facility-administered medications for this visit.    Allergies:   Shrimp [shellfish allergy] and Metoprolol tartrate    Social History:  The patient  reports that he has never smoked. He has never used smokeless tobacco. He reports current alcohol use of about 1.0 standard drinks of alcohol per week. He reports that he does not use drugs.   Family History:  The patient's family history includes Diabetes in his father; Hypertension in his mother.    ROS:  Please see the history of present illness. Otherwise, review of systems are positive for none.  All other systems are reviewed and negative.    PHYSICAL EXAM: VS:  BP 138/82   Pulse 71   Ht 5\' 8"  (1.727 m)   Wt 290 lb 12.8 oz (131.9 kg)   SpO2 95%   BMI 44.22 kg/m  , BMI Body mass index is 44.22 kg/m.   General: Overweight, NAD Neck: Negative for carotid bruits. No JVD Lungs:Clear to ausculation bilaterally. No wheezes, rales, or rhonchi. Breathing is unlabored. Cardiovascular: RRR with S1 S2. No murmurs, rubs, gallops, or LV heave appreciated. Extremities: No edema. DP pulses 2+ bilaterally Neuro: Alert and oriented. No focal deficits. No facial asymmetry. MAE spontaneously. Psych: Responds to questions appropriately with normal affect.     EKG:  EKG is not ordered today.   Recent Labs: No results found for requested labs within last 8760 hours.    Lipid Panel    Component Value Date/Time   CHOL 160 05/07/2014 1507   TRIG 88 05/07/2014 1507   HDL 48 05/07/2014 1507   CHOLHDL 3.3 05/07/2014 1507   VLDL 18 05/07/2014 1507   LDLCALC 94 05/07/2014 1507     Wt Readings from Last 3 Encounters:  07/19/19 290 lb 12.8 oz (131.9 kg)  07/05/19 286 lb 6.4 oz (129.9 kg)  05/28/19 284 lb 12.8 oz (129.2 kg)     Other studies Reviewed: Additional studies/ records that were reviewed today include:   Echocardiogram 05/18/2019:  1. Left ventricular  ejection fraction, by visual estimation, is 55 to 60%. The left ventricle has normal function. There is severely increased left ventricular hypertrophy.  2. Left ventricular diastolic parameters are consistent with Grade II diastolic dysfunction (pseudonormalization).  3. Global right ventricle has normal systolic function.The right ventricular size is normal. No increase in right ventricular wall thickness.  4. Left atrial size was normal.  5. Right atrial size was normal.  6. The pericardial effusion is posterior to the left ventricle.  7. Trivial pericardial effusion is present.  8. The mitral valve is normal in structure. Trace mitral valve regurgitation. No evidence of mitral stenosis.  9. The tricuspid valve is normal in structure. Tricuspid valve regurgitation is not demonstrated. 10. The aortic valve is tricuspid. Aortic valve regurgitation is not visualized. Mild aortic valve sclerosis without  stenosis. 11. The pulmonic valve was normal in structure. Pulmonic valve regurgitation is not visualized. 12. The inferior vena cava is normal in size with greater than 50% respiratory variability, suggesting right atrial pressure of 3 mmHg.  ASSESSMENT AND PLAN:  1.  LE edema: -Recently seen by Dr. Katrinka Blazing 07/05/2019 with complaints of LE edema and mild BP elevation at which time his losartan was transitioned to losartan/HCTZ 50/12.5 mg -Reports improvement in LE edema, ankle pain -Repeat lab work today>> anticipate stable creatinine  2.  LVH: -Seen on prior echocardiogram from 05/18/2019 which showed an LVEF of 55 to 60% with severe LVH and G2 DD -BP mildly elevated at last OV with transition to losartan/HCTZ 50/12.5 mg -Continue with good BP control -Most recently added losartan/HCTZ 50/12.5 with better control at 138/82 today  3.  Uncontrolled narcolepsy: -Maintained on CPAP   Current medicines are reviewed at length with the patient today.  The patient does not have concerns regarding  medicines.  The following changes have been made:  no change  Labs/ tests ordered today include: BMET No orders of the defined types were placed in this encounter.   Disposition:   FU with Dr. Katrinka Blazing in 6 months  Signed, Georgie Chard, NP  07/19/2019 3:41 PM    [facility] [address] [place], [place], [place]  [address] Phone: [phone]; Fax: [phone]

## 2019-07-19 ENCOUNTER — Other Ambulatory Visit: Payer: BC Managed Care – PPO | Admitting: *Deleted

## 2019-07-19 ENCOUNTER — Ambulatory Visit (INDEPENDENT_AMBULATORY_CARE_PROVIDER_SITE_OTHER): Payer: Self-pay | Admitting: Cardiology

## 2019-07-19 ENCOUNTER — Other Ambulatory Visit: Payer: Self-pay

## 2019-07-19 ENCOUNTER — Encounter: Payer: Self-pay | Admitting: Cardiology

## 2019-07-19 VITALS — BP 138/82 | HR 71 | Ht 68.0 in | Wt 290.8 lb

## 2019-07-19 DIAGNOSIS — G47419 Narcolepsy without cataplexy: Secondary | ICD-10-CM

## 2019-07-19 DIAGNOSIS — I517 Cardiomegaly: Secondary | ICD-10-CM

## 2019-07-19 DIAGNOSIS — I10 Essential (primary) hypertension: Secondary | ICD-10-CM

## 2019-07-19 NOTE — Patient Instructions (Signed)
Medication Instructions:   Your physician recommends that you continue on your current medications as directed. Please refer to the Current Medication list given to you today.  *If you need a refill on your cardiac medications before your next appointment, please call your pharmacy*  Lab Work:  You will have labs drawn today: BMET  If you have labs (blood work) drawn today and your tests are completely normal, you will receive your results only by: Marland Kitchen MyChart Message (if you have MyChart) OR . A paper copy in the mail If you have any lab test that is abnormal or we need to change your treatment, we will call you to review the results.  Testing/Procedures:  None ordered today  Follow-Up: At [facility], you and your health needs are our priority.  As part of our continuing mission to provide you with exceptional heart care, we have created designated Provider Care Teams.  These Care Teams include your primary Cardiologist (physician) and Advanced Practice Providers (APPs -  Physician Assistants and Nurse Practitioners) who all work together to provide you with the care you need, when you need it.  Your next appointment:   6 month(s)  The format for your next appointment:   In Person  Provider:   You may see Lesleigh Noe, MD or one of the following Advanced Practice Providers on your designated Care Team:    Norma Fredrickson, NP  Nada Boozer, NP  Georgie Chard, NP

## 2019-07-20 LAB — BASIC METABOLIC PANEL
BUN/Creatinine Ratio: 13 (ref 9–20)
BUN: 15 mg/dL (ref 6–24)
CO2: 27 mmol/L (ref 20–29)
Calcium: 9.7 mg/dL (ref 8.7–10.2)
Chloride: 100 mmol/L (ref 96–106)
Creatinine, Ser: 1.16 mg/dL (ref 0.76–1.27)
GFR calc Af Amer: 84 mL/min/{1.73_m2} (ref >59)
GFR calc non Af Amer: 73 mL/min/{1.73_m2} (ref >59)
Glucose: 93 mg/dL (ref 65–99)
Potassium: 4.1 mmol/L (ref 3.5–5.2)
Sodium: 141 mmol/L (ref 134–144)

## 2019-09-05 ENCOUNTER — Telehealth: Payer: Self-pay | Admitting: Interventional Cardiology

## 2019-09-05 NOTE — Telephone Encounter (Signed)
Left message to call back. Also stated that I would release results to MyChart and if easier pt could respond through there.

## 2019-09-05 NOTE — Telephone Encounter (Signed)
Patient returning Jennifer's call in regards to lab results. Please call cell phone.

## 2019-09-13 ENCOUNTER — Encounter: Payer: Self-pay | Admitting: *Deleted

## 2019-09-25 NOTE — Telephone Encounter (Signed)
Spoke with pt and he states Dr. Manus Gunning did not change any medications based on results.  Advised pt of recommendations per Dr. Katrinka Blazing.  Pt states that he would like to hold off on starting a statin because he has read info on the side effects.  Pt states he is having side effects with other meds he is on that were prescribed by other providers.  He is working on diet and exercise to bring down his cholesterol numbers.  Spoke to pt about importance of lowering this number and medication is likely needed.  Pt would still like to hold off for now.  States he is currently on a diet supplement to help him lose weight. Pt is due to be seen in July.  Advised when he is contacted to make that appt, make a morning appt and come fasting so we can recheck labs if Dr. Katrinka Blazing wants to.

## 2019-11-05 ENCOUNTER — Other Ambulatory Visit: Payer: Self-pay | Admitting: Cardiology

## 2020-03-30 ENCOUNTER — Other Ambulatory Visit: Payer: Self-pay | Admitting: Cardiology

## 2020-04-23 NOTE — Progress Notes (Signed)
Cardiology Office Note:    Date:  04/25/2020   ID:  EAGLE PITTA, DOB 10-14-1965, MRN [id number]  PCP:  Gaynelle Arabian, MD  Cardiologist:  Sinclair Grooms, MD   Referring MD: Gaynelle Arabian, MD   Chief Complaint  Patient presents with  . Hypertension    History of Present Illness:    Kyle Cross is a 54 y.o. male with a hx of HTN,OSApreviouslyon CPAPwhich later was diagnosed as atypical narcolepsyand hypertensive cardiovascular disease (LVH and LAE on echocardiogram) andno prior documented CAD. He has a chronically abnormal EKG.  With physical activity such as working in his yard, he gets short of breath and occasionally gets tightness in his chest.  He is not ordinarily short of breath.  He has no discomfort at rest.  No dyspnea at rest.  Able to lie down without shortness of breath.  No lower extremity swelling.  Not taking the antihypertensive therapy that was recommended.  I started him on losartan HCTZ 50/12.5 mg a year ago but he stopped taking it after 2 weeks.  His other therapy includes diltiazem 240 mg/day which he continues to take.  Past Medical History:  Diagnosis Date  . Dyspnea   . Fatigue   . GERD (gastroesophageal reflux disease)   . HTN (hypertension)   . Hypertensive cardiovascular disease    a. LVH, LAE noted on ECG  . Hypertrophic cardiomyopathy ([facility])    a. noted on LHC on 04/06/14.   Marland Kitchen Neuropathy 2012   felt to be infectious (viral)  . OSA on CPAP     Past Surgical History:  Procedure Laterality Date  . CARDIAC CATHETERIZATION  05/07/2014  . LEFT HEART CATHETERIZATION WITH CORONARY ANGIOGRAM N/A 05/07/2014   Procedure: LEFT HEART CATHETERIZATION WITH CORONARY ANGIOGRAM;  Surgeon: Sinclair Grooms, MD;  Location: [facility] CATH LAB;  Service: Cardiovascular;  Laterality: N/A;  . SCROTAL EXPLORATION  Oct 7416   foliculitis  . VASECTOMY  02/1998    Current Medications: Current Meds  Medication Sig  . Armodafinil 50 MG tablet Take 50 mg by  mouth daily.   Marland Kitchen aspirin EC 81 MG tablet Take 81 mg by mouth daily.  Marland Kitchen diltiazem (CARDIZEM CD) 240 MG 24 hr capsule TAKE 1 CAPSULE BY MOUTH EVERY DAY FOR HEART AND BLOOD PRESSURE  . Multiple Vitamin (MULTIVITAMIN WITH MINERALS) TABS tablet Take 1 tablet by mouth daily.  . sildenafil (REVATIO) 20 MG tablet Take 2-5 tablets by mouth as directed. 30-60 mins before sex     Allergies:   Shrimp [shellfish allergy] and Metoprolol tartrate   Social History   Socioeconomic History  . Marital status: Married    Spouse name: Not on file  . Number of children: Not on file  . Years of education: Not on file  . Highest education level: Not on file  Occupational History  . Not on file  Tobacco Use  . Smoking status: Never Smoker  . Smokeless tobacco: Never Used  Substance and Sexual Activity  . Alcohol use: Yes    Alcohol/week: 1.0 standard drink    Types: 1 Glasses of wine per week  . Drug use: No  . Sexual activity: Yes  Other Topics Concern  . Not on file  Social History Narrative  . Not on file   Social Determinants of Health   Financial Resource Strain:   . Difficulty of Paying Living Expenses: Not on file  Food Insecurity:   . Worried About [employer] of  Food in the Last Year: Not on file  . Ran Out of Food in the Last Year: Not on file  Transportation Needs:   . Lack of Transportation (Medical): Not on file  . Lack of Transportation (Non-Medical): Not on file  Physical Activity:   . Days of Exercise per Week: Not on file  . Minutes of Exercise per Session: Not on file  Stress:   . Feeling of Stress : Not on file  Social Connections:   . Frequency of Communication with Friends and Family: Not on file  . Frequency of Social Gatherings with Friends and Family: Not on file  . Attends Religious Services: Not on file  . Active Member of Clubs or Organizations: Not on file  . Attends Archivist Meetings: Not on file  . Marital Status: Not on file     Family  History: The patient's family history includes Diabetes in his father; Hypertension in his mother. There is no history of Heart attack.  ROS:   Please see the history of present illness.    Stopped Hyzaar because it caused him to urinate excessively.  CPAP is been stopped.  He feels armodafinil causes him trouble and may be elevating his blood pressure.  All other systems reviewed and are negative.  EKGs/Labs/Other Studies Reviewed:    The following studies were reviewed today:  CARDIAC MRI 2017 FINDINGS: 1. Normal left ventricular size, with moderate concentric hypertrophy and normal systolic function (LVEF = 61%) with no regional wall motion abnormalities.  No systolic anterior motion was seen.  2. Normal right ventricular size, thickness and systolic function (RVEF = 50%) with no regional wall motion abnormalities.  3.  Normal biatrial size.  4.  Mild mitral and trivial tricuspid regurgitation.  5. Normal size of the aortic root and aorta. Mildly dilated pulmonary artery.  IMPRESSION: The study was terminated early as the patient developed severe claustrophobia, no post gadolinium images were acquired.  1. Normal left ventricular size, with moderate concentric hypertrophy and normal systolic function (LVEF = 61%) with no regional wall motion abnormalities.  No systolic anterior motion was seen. No late gadolinium images were acquired, however this appears as a hypertensive heart disease rather than hypertrophic cardiomyopathy.  2. Normal right ventricular size, thickness and systolic function (RVEF = 50%) with no regional wall motion abnormalities.  3.  Mild mitral and trivial tricuspid regurgitation.  4. Mildly dilated pulmonary artery.  Ena Dawley   Electronically Signed   By: Ena Dawley   On: 12/19/2015 19:09  05/18/2019 ECHOCARDIOGRAM IMPRESSIONS    1. Left ventricular ejection fraction, by visual estimation, is 55 to  60%.  The left ventricle has normal function. There is severely increased  left ventricular hypertrophy.  2. Left ventricular diastolic parameters are consistent with Grade II  diastolic dysfunction (pseudonormalization).  3. Global right ventricle has normal systolic function.The right  ventricular size is normal. No increase in right ventricular wall  thickness.  4. Left atrial size was normal.  5. Right atrial size was normal.  6. The pericardial effusion is posterior to the left ventricle.  7. Trivial pericardial effusion is present.  8. The mitral valve is normal in structure. Trace mitral valve  regurgitation. No evidence of mitral stenosis.  9. The tricuspid valve is normal in structure. Tricuspid valve  regurgitation is not demonstrated.  10. The aortic valve is tricuspid. Aortic valve regurgitation is not  visualized. Mild aortic valve sclerosis without stenosis.  11. The pulmonic valve  was normal in structure. Pulmonic valve  regurgitation is not visualized.  12. The inferior vena cava is normal in size with greater than 50%  respiratory variability, suggesting right atrial pressure of 3 mmHg.   EKG:  EKG LVH with strain.  Interventricular conduction delay, prominent voltage, symmetrical T wave inversion in the inferior leads.  Recent Labs: 07/19/2019: BUN 15; Creatinine, Ser 1.16; Potassium 4.1; Sodium 141  Recent Lipid Panel    Component Value Date/Time   CHOL 160 05/07/2014 1507   TRIG 88 05/07/2014 1507   HDL 48 05/07/2014 1507   CHOLHDL 3.3 05/07/2014 1507   VLDL 18 05/07/2014 1507   LDLCALC 94 05/07/2014 1507    Physical Exam:    VS:  BP (!) 156/70   Pulse 71   Ht 5' 8" (1.727 m)   Wt 286 lb (129.7 kg)   SpO2 96%   BMI 43.49 kg/m     Wt Readings from Last 3 Encounters:  04/25/20 286 lb (129.7 kg)  07/19/19 290 lb 12.8 oz (131.9 kg)  07/05/19 286 lb 6.4 oz (129.9 kg)     GEN: Morbidly obese with BMI of 44.. No acute distress HEENT: Normal NECK:  No JVD. LYMPHATICS: No lymphadenopathy CARDIAC: S4 gallop is audible RRR without murmur, S3 gallop, or edema. VASCULAR:  Normal Pulses. No bruits. RESPIRATORY:  Clear to auscultation without rales, wheezing or rhonchi  ABDOMEN: Soft, non-tender, non-distended, No pulsatile mass, MUSCULOSKELETAL: No deformity  SKIN: Warm and dry NEUROLOGIC:  Alert and oriented x 3 PSYCHIATRIC:  Normal affect   ASSESSMENT:    1. Hypertension, essential   2. LVH (left ventricular hypertrophy)   3. Uncontrolled narcolepsy   4. Erectile dysfunction, unspecified erectile dysfunction type   5. Educated about COVID-19 virus infection    PLAN:    In order of problems listed above:  1. Poor control and poor compliance.  Stopped combo Hyzaar 50/12.5 mg without letting us know.  This left him on diltiazem as his only agent.  Resume diltiazem HCTZ.  Discussed the relevance of having diuretic therapy on board and an African-American.  May need to switch diltiazem to amlodipine.  He will need to return in 3 to 4 weeks for blood pressure repeat.  Be met will be done at the same time.  We may quickly need to add additional losartan.  If additional diuretic is needed I would consider spinal lactone 12.5 mg/day. 2. I am sure left-ventricular hypertrophy has not gotten better. 3. Need to consider whether armodafinil should continue to be used.  If this has sympathomimetic activity it could be aggravating the underlying blood pressure problem. 4. We did not discuss.  He is on Revatio. 5. Vaccinated and taken precautions to avoid infection.   Medication Adjustments/Labs and Tests Ordered: Current medicines are reviewed at length with the patient today.  Concerns regarding medicines are outlined above.  Orders Placed This Encounter  Procedures  . Basic metabolic panel  . EKG 12-Lead   Meds ordered this encounter  Medications  . losartan-hydrochlorothiazide (HYZAAR) 50-12.5 MG tablet    Sig: Take 1 tablet by mouth  daily.    Dispense:  90 tablet    Refill:  3    Patient Instructions  Medication Instructions:  1) RESTART the Losartan/HCTZ 50/12.46m once daily  *If you need a refill on your cardiac medications before your next appointment, please call your pharmacy*   Lab Work: BMET in 3 weeks  If you have labs (blood work) drawn today  and your tests are completely normal, you will receive your results only by: Marland Kitchen MyChart Message (if you have MyChart) OR . A paper copy in the mail If you have any lab test that is abnormal or we need to change your treatment, we will call you to review the results.   Testing/Procedures: None   Follow-Up: At [facility], you and your health needs are our priority.  As part of our continuing mission to provide you with exceptional heart care, we have created designated Provider Care Teams.  These Care Teams include your primary Cardiologist (physician) and Advanced Practice Providers (APPs -  Physician Assistants and Nurse Practitioners) who all work together to provide you with the care you need, when you need it.  We recommend signing up for the patient portal called "MyChart".  Sign up information is provided on this After Visit Summary.  MyChart is used to connect with patients for Virtual Visits (Telemedicine).  Patients are able to view lab/test results, encounter notes, upcoming appointments, etc.  Non-urgent messages can be sent to your provider as well.   To learn more about what you can do with MyChart, go to [email or web address].    Your next appointment:   3 weeks  The format for your next appointment:   In Person  Provider:   You may see Sinclair Grooms, MD or one of the following Advanced Practice Providers on your designated Care Team:    Truitt Merle, NP  Cecilie Kicks, NP  Kathyrn Drown, NP    Other Instructions      Signed, Sinclair Grooms, MD  04/25/2020 5:12 PM    [place]

## 2020-04-25 ENCOUNTER — Encounter: Payer: Self-pay | Admitting: Interventional Cardiology

## 2020-04-25 ENCOUNTER — Other Ambulatory Visit: Payer: Self-pay

## 2020-04-25 ENCOUNTER — Ambulatory Visit (INDEPENDENT_AMBULATORY_CARE_PROVIDER_SITE_OTHER): Payer: BC Managed Care – PPO | Admitting: Interventional Cardiology

## 2020-04-25 VITALS — BP 156/70 | HR 71 | Ht 68.0 in | Wt 286.0 lb

## 2020-04-25 DIAGNOSIS — N529 Male erectile dysfunction, unspecified: Secondary | ICD-10-CM | POA: Diagnosis not present

## 2020-04-25 DIAGNOSIS — G47419 Narcolepsy without cataplexy: Secondary | ICD-10-CM | POA: Diagnosis not present

## 2020-04-25 DIAGNOSIS — Z7189 Other specified counseling: Secondary | ICD-10-CM

## 2020-04-25 DIAGNOSIS — I10 Essential (primary) hypertension: Secondary | ICD-10-CM

## 2020-04-25 DIAGNOSIS — I517 Cardiomegaly: Secondary | ICD-10-CM | POA: Diagnosis not present

## 2020-04-25 MED ORDER — LOSARTAN POTASSIUM-HCTZ 50-12.5 MG PO TABS: 1.0000 | ORAL_TABLET | Freq: Every day | ORAL | 3 refills | Status: DC

## 2020-04-25 NOTE — Patient Instructions (Addendum)
Medication Instructions:  1) RESTART the Losartan/HCTZ 50/12.5mg  once daily  *If you need a refill on your cardiac medications before your next appointment, please call your pharmacy*   Lab Work: BMET in 3 weeks  If you have labs (blood work) drawn today and your tests are completely normal, you will receive your results only by: Marland Kitchen MyChart Message (if you have MyChart) OR . A paper copy in the mail If you have any lab test that is abnormal or we need to change your treatment, we will call you to review the results.   Testing/Procedures: None   Follow-Up: At [facility], you and your health needs are our priority.  As part of our continuing mission to provide you with exceptional heart care, we have created designated Provider Care Teams.  These Care Teams include your primary Cardiologist (physician) and Advanced Practice Providers (APPs -  Physician Assistants and Nurse Practitioners) who all work together to provide you with the care you need, when you need it.  We recommend signing up for the patient portal called "MyChart".  Sign up information is provided on this After Visit Summary.  MyChart is used to connect with patients for Virtual Visits (Telemedicine).  Patients are able to view lab/test results, encounter notes, upcoming appointments, etc.  Non-urgent messages can be sent to your provider as well.   To learn more about what you can do with MyChart, go to [email or web address].    Your next appointment:   3 weeks  The format for your next appointment:   In Person  Provider:   You may see Lesleigh Noe, MD or one of the following Advanced Practice Providers on your designated Care Team:    Norma Fredrickson, NP  Nada Boozer, NP  Georgie Chard, NP    Other Instructions

## 2020-05-05 NOTE — Progress Notes (Addendum)
CARDIOLOGY OFFICE NOTE  Date:  05/20/2020    Kyle Cross Date of Birth: 1966/05/21 Medical Record #[id number]  PCP:  Blair Heys, MD  Cardiologist: Katrinka Blazing  Chief Complaint  Patient presents with  . Follow-up    History of Present Illness: Kyle Cross is a 54 y.o. male who presents today for a follow up visit. Seen for Dr. Katrinka Blazing.  He has a history of HTN,OSApreviouslyon CPAPwhich later was diagnosed as atypical narcolepsy,  hypertensive cardiovascular disease (LVH and LAE on echocardiogram) andno prior documented CAD. He has a chronically abnormal EKG.  He has not taken his medicines as recommended.  Last seen a month ago.   Comes in today. Here alone. He feels ok. BP still up - he is back on ARB with HCTZ - remains on CCB as well. He says he is restricting his salt - we then reviewed some of the things he has eaten recently - he eats lots of fast food - had 2 cheeseburgers from [employer] - was planning on ham for Thanksgiving, etc. Does not have BP cuff at home. He is NOT vaccinated - very scared - has had Guillan-[place]re' - from what sounds like prior flu shot. He continues to mask.   Past Medical History:  Diagnosis Date  . Dyspnea   . Fatigue   . GERD (gastroesophageal reflux disease)   . HTN (hypertension)   . Hypertensive cardiovascular disease    a. LVH, LAE noted on ECG  . Hypertrophic cardiomyopathy ([facility])    a. noted on LHC on 04/06/14.   Marland Kitchen Neuropathy 2012   felt to be infectious (viral)  . OSA on CPAP     Past Surgical History:  Procedure Laterality Date  . CARDIAC CATHETERIZATION  05/07/2014  . LEFT HEART CATHETERIZATION WITH CORONARY ANGIOGRAM N/A 05/07/2014   Procedure: LEFT HEART CATHETERIZATION WITH CORONARY ANGIOGRAM;  Surgeon: Lesleigh Noe, MD;  Location: [facility] CATH LAB;  Service: Cardiovascular;  Laterality: N/A;  . SCROTAL EXPLORATION  Oct 2012   foliculitis  . VASECTOMY  02/1998     Medications: Current Meds  Medication Sig    . Armodafinil 50 MG tablet Take 50 mg by mouth daily.   Marland Kitchen aspirin EC 81 MG tablet Take 81 mg by mouth daily.  Marland Kitchen diltiazem (CARDIZEM CD) 240 MG 24 hr capsule TAKE 1 CAPSULE BY MOUTH EVERY DAY FOR HEART AND BLOOD PRESSURE  . losartan-hydrochlorothiazide (HYZAAR) 50-12.5 MG tablet Take 1 tablet by mouth daily.  . Multiple Vitamin (MULTIVITAMIN WITH MINERALS) TABS tablet Take 1 tablet by mouth daily.  . sildenafil (REVATIO) 20 MG tablet Take 2-5 tablets by mouth as directed. 30-60 mins before sex     Allergies: Allergies  Allergen Reactions  . Shrimp [Shellfish Allergy] Anaphylaxis  . Metoprolol Tartrate Other (See Comments)    Fatigue and erectile dysfunction     Social History: The patient  reports that he has never smoked. He has never used smokeless tobacco. He reports current alcohol use of about 1.0 standard drink of alcohol per week. He reports that he does not use drugs.   Family History: The patient's family history includes Diabetes in his father; Hypertension in his mother.   Review of Systems: Please see the history of present illness.   All other systems are reviewed and negative.   Physical Exam: VS:  BP (!) 150/86   Pulse 84   Ht 5\' 8"  (1.727 m)   Wt 287 lb (130.2 kg)  SpO2 97%   BMI 43.64 kg/m  .  BMI Body mass index is 43.64 kg/m.  Wt Readings from Last 3 Encounters:  05/19/20 287 lb (130.2 kg)  04/25/20 286 lb (129.7 kg)  07/19/19 290 lb 12.8 oz (131.9 kg)   BP by me is 160/80.  General: Alert and in no acute distress.  He is obese.  Cardiac: Regular rate and rhythm. Heart tones are distant - may have an S4 noted.  Trace edema.  Respiratory:  Lungs are clear to auscultation bilaterally with normal work of breathing.  GI: Soft and nontender.  MS: No deformity or atrophy. Gait and ROM intact.  Skin: Warm and dry. Color is normal.  Neuro:  Strength and sensation are intact and no gross focal deficits noted.  Psych: Alert, appropriate and with normal  affect.   LABORATORY DATA:  EKG:  EKG is not ordered today.    Lab Results  Component Value Date   WBC 5.7 09/20/2017   HGB 13.8 09/20/2017   HCT 41.5 09/20/2017   PLT 244 09/20/2017   GLUCOSE 103 (H) 05/19/2020   CHOL 160 05/07/2014   TRIG 88 05/07/2014   HDL 48 05/07/2014   LDLCALC 94 05/07/2014   ALT 21 05/07/2014   AST 19 05/07/2014   NA 141 05/19/2020   K 4.0 05/19/2020   CL 102 05/19/2020   CREATININE 0.98 05/19/2020   BUN 15 05/19/2020   CO2 28 05/19/2020   TSH 0.840 09/20/2017   PSA 0.62 Test Methodology: [employer] PSA 10/23/2010   INR 1.01 05/07/2014   HGBA1C 5.9 (H) 09/20/2017       BNP (last 3 results) No results for input(s): BNP in the last 8760 hours.  ProBNP (last 3 results) No results for input(s): PROBNP in the last 8760 hours.   Other Studies Reviewed Today:  CARDIAC MRI 2017 FINDINGS: 1. Normal left ventricular size, with moderate concentric hypertrophy and normal systolic function (LVEF = 61%) with no regional wall motion abnormalities.  No systolic anterior motion was seen.  2. Normal right ventricular size, thickness and systolic function (RVEF = 50%) with no regional wall motion abnormalities.  3. Normal biatrial size.  4. Mild mitral and trivial tricuspid regurgitation.  5. Normal size of the aortic root and aorta. Mildly dilated pulmonary artery.  IMPRESSION: The study was terminated early as the patient developed severe claustrophobia, no post gadolinium images were acquired.  1. Normal left ventricular size, with moderate concentric hypertrophy and normal systolic function (LVEF = 61%) with no regional wall motion abnormalities.  No systolic anterior motion was seen. No late gadolinium images were acquired, however this appears as a hypertensive heart disease rather than hypertrophic cardiomyopathy.  2. Normal right ventricular size, thickness and systolic function (RVEF = 50%) with no regional wall  motion abnormalities.  3. Mild mitral and trivial tricuspid regurgitation.  4. Mildly dilated pulmonary artery.  [name]   Electronically Signed By: [name] On: 12/19/2015 19:09   05/18/2019 ECHOCARDIOGRAM IMPRESSIONS  1. Left ventricular ejection fraction, by visual estimation, is 55 to  60%. The left ventricle has normal function. There is severely increased  left ventricular hypertrophy.  2. Left ventricular diastolic parameters are consistent with Grade II  diastolic dysfunction (pseudonormalization).  3. Global right ventricle has normal systolic function.The right  ventricular size is normal. No increase in right ventricular wall  thickness.  4. Left atrial size was normal.  5. Right atrial size was normal.  6. The pericardial effusion is  posterior to the left ventricle.  7. Trivial pericardial effusion is present.  8. The mitral valve is normal in structure. Trace mitral valve  regurgitation. No evidence of mitral stenosis.  9. The tricuspid valve is normal in structure. Tricuspid valve  regurgitation is not demonstrated.  10. The aortic valve is tricuspid. Aortic valve regurgitation is not  visualized. Mild aortic valve sclerosis without stenosis.  11. The pulmonic valve was normal in structure. Pulmonic valve  regurgitation is not visualized.  12. The inferior vena cava is normal in size with greater than 50%  respiratory variability, suggesting right atrial pressure of 3 mmHg.     ASSESSMENT & PLAN:    1. HTN - poor control - adding Aldactone today - will check BMEt - starting 12.5 mg dose - if potassium is ok - will increase to 25 mg a day. BMEt in about a week. Will give him a BP cuff for use at home - will need to put on forearm due to his body habitus. Salt restriction is imperative - discussed at length with him.   2. LVH  3 Uncontrolled narcolepsy - remains on Armodafinil - may be aggravating his BP but we certainly  could have better sodium restriction.   4. ED - not discussed.   5. OSA - using CPAP  Current medicines are reviewed with the patient today.  The patient does not have concerns regarding medicines other than what has been noted above.  The following changes have been made:  See above.  Labs/ tests ordered today include:    Orders Placed This Encounter  Procedures  . Basic metabolic panel  . Basic metabolic panel     Disposition:   FU with Dr. Katrinka Blazing in about a month. BMEt today and in a week. Starting Aldactone today.    Patient is agreeable to this plan and will call if any problems develop in the interim.   SignedNorma Fredrickson, NP  05/20/2020 1:53 PM  [facility] [address] [place], [place]  [address] Phone: [phone] Fax: [phone]       Addendum: 05/20/20 Phone call from Dr. Manus Gunning this afternoon. Mr. Gravley dropped off a form for COVID vaccine exemption to his office. Dr. Manus Gunning searched his medical record and has reviewed the events of 2012 and found that the medical chart noted that his exam at that time was NOT consistent with Kyle Cross'. Dr. Manus Gunning is declining his exemption and wanted our office to be aware.   I would support vaccination for COVID 19 given his medical issues.   Rosalio Macadamia, RN, ANP-C [facility] [address] [place], [place]  [address] [phone]

## 2020-05-19 ENCOUNTER — Ambulatory Visit (INDEPENDENT_AMBULATORY_CARE_PROVIDER_SITE_OTHER): Payer: BC Managed Care – PPO | Admitting: Nurse Practitioner

## 2020-05-19 ENCOUNTER — Other Ambulatory Visit: Payer: Self-pay

## 2020-05-19 ENCOUNTER — Encounter: Payer: Self-pay | Admitting: Nurse Practitioner

## 2020-05-19 ENCOUNTER — Other Ambulatory Visit: Payer: BC Managed Care – PPO

## 2020-05-19 VITALS — BP 150/86 | HR 84 | Ht 68.0 in | Wt 287.0 lb

## 2020-05-19 DIAGNOSIS — I10 Essential (primary) hypertension: Secondary | ICD-10-CM

## 2020-05-19 DIAGNOSIS — Z9989 Dependence on other enabling machines and devices: Secondary | ICD-10-CM

## 2020-05-19 DIAGNOSIS — I517 Cardiomegaly: Secondary | ICD-10-CM

## 2020-05-19 DIAGNOSIS — G4733 Obstructive sleep apnea (adult) (pediatric): Secondary | ICD-10-CM

## 2020-05-19 DIAGNOSIS — G47419 Narcolepsy without cataplexy: Secondary | ICD-10-CM | POA: Diagnosis not present

## 2020-05-19 MED ORDER — SPIRONOLACTONE 25 MG PO TABS: 12.5000 mg | ORAL_TABLET | Freq: Every day | ORAL | 3 refills | Status: DC

## 2020-05-19 NOTE — Patient Instructions (Addendum)
After Visit Summary:  We will be checking the following labs today - BMET  BMEt in about a week   Medication Instructions:    Continue with your current medicines. BUT  I am starting Aldactone 25 mg a day - start with just 1/2 a pill until we call with your labs   If you need a refill on your cardiac medications before your next appointment, please call your pharmacy.     Testing/Procedures To Be Arranged:  N/A  Follow-Up:   See Dr. Katrinka Blazing in about a month    At [facility], you and your health needs are our priority.  As part of our continuing mission to provide you with exceptional heart care, we have created designated Provider Care Teams.  These Care Teams include your primary Cardiologist (physician) and Advanced Practice Providers (APPs -  Physician Assistants and Nurse Practitioners) who all work together to provide you with the care you need, when you need it.  Special Instructions:  . Stay safe, wash your hands for at least 20 seconds and wear a mask when needed.  . It was good to talk with you today.    Low-Sodium Eating Plan Sodium, which is an element that makes up salt, helps you maintain a healthy balance of fluids in your body. Too much sodium can increase your blood pressure and cause fluid and waste to be held in your body. Your health care provider or dietitian may recommend following this plan if you have high blood pressure (hypertension), kidney disease, liver disease, or heart failure. Eating less sodium can help lower your blood pressure, reduce swelling, and protect your heart, liver, and kidneys. What are tips for following this plan? General guidelines  Most people on this plan should limit their sodium intake to 1,500-2,000 mg (milligrams) of sodium each day. Reading food labels   The Nutrition Facts label lists the amount of sodium in one serving of the food. If you eat more than one serving, you must multiply the listed amount of sodium by  the number of servings.  Choose foods with less than 140 mg of sodium per serving.  Avoid foods with 300 mg of sodium or more per serving. Shopping  Look for lower-sodium products, often labeled as "low-sodium" or "no salt added."  Always check the sodium content even if foods are labeled as "unsalted" or "no salt added".  Buy fresh foods. ? Avoid canned foods and premade or frozen meals. ? Avoid canned, cured, or processed meats  Buy breads that have less than 80 mg of sodium per slice. Cooking  Eat more home-cooked food and less restaurant, buffet, and fast food.  Avoid adding salt when cooking. Use salt-free seasonings or herbs instead of table salt or sea salt. Check with your health care provider or pharmacist before using salt substitutes.  Cook with plant-based oils, such as canola, sunflower, or olive oil. Meal planning  When eating at a restaurant, ask that your food be prepared with less salt or no salt, if possible.  Avoid foods that contain MSG (monosodium glutamate). MSG is sometimes added to [place] food, bouillon, and some canned foods. What foods are recommended? The items listed may not be a complete list. Talk with your dietitian about what dietary choices are best for you. Grains Low-sodium cereals, including oats, puffed wheat and rice, and shredded wheat. Low-sodium crackers. Unsalted rice. Unsalted pasta. Low-sodium bread. Whole-grain breads and whole-grain pasta. Vegetables Fresh or frozen vegetables. "No salt added" canned vegetables. "  No salt added" tomato sauce and paste. Low-sodium or reduced-sodium tomato and vegetable juice. Fruits Fresh, frozen, or canned fruit. Fruit juice. Meats and other protein foods Fresh or frozen (no salt added) meat, poultry, seafood, and fish. Low-sodium canned tuna and salmon. Unsalted nuts. Dried peas, beans, and lentils without added salt. Unsalted canned beans. Eggs. Unsalted nut butters. Dairy Milk. Soy milk. Cheese  that is naturally low in sodium, such as ricotta cheese, fresh mozzarella, or Swiss cheese Low-sodium or reduced-sodium cheese. Cream cheese. Yogurt. Fats and oils Unsalted butter. Unsalted margarine with no trans fat. Vegetable oils such as canola or olive oils. Seasonings and other foods Fresh and dried herbs and spices. Salt-free seasonings. Low-sodium mustard and ketchup. Sodium-free salad dressing. Sodium-free light mayonnaise. Fresh or refrigerated horseradish. Lemon juice. Vinegar. Homemade, reduced-sodium, or low-sodium soups. Unsalted popcorn and pretzels. Low-salt or salt-free chips. What foods are not recommended? The items listed may not be a complete list. Talk with your dietitian about what dietary choices are best for you. Grains Instant hot cereals. Bread stuffing, pancake, and biscuit mixes. Croutons. Seasoned rice or pasta mixes. Noodle soup cups. Boxed or frozen macaroni and cheese. Regular salted crackers. Self-rising flour. Vegetables Sauerkraut, pickled vegetables, and relishes. Olives. [place] fries. Onion rings. Regular canned vegetables (not low-sodium or reduced-sodium). Regular canned tomato sauce and paste (not low-sodium or reduced-sodium). Regular tomato and vegetable juice (not low-sodium or reduced-sodium). Frozen vegetables in sauces. Meats and other protein foods Meat or fish that is salted, canned, smoked, spiced, or pickled. Bacon, ham, sausage, hotdogs, corned beef, chipped beef, packaged lunch meats, salt pork, jerky, pickled herring, anchovies, regular canned tuna, sardines, salted nuts. Dairy Processed cheese and cheese spreads. Cheese curds. Blue cheese. Feta cheese. String cheese. Regular cottage cheese. Buttermilk. Canned milk. Fats and oils Salted butter. Regular margarine. Ghee. Bacon fat. Seasonings and other foods Onion salt, garlic salt, seasoned salt, table salt, and sea salt. Canned and packaged gravies. Worcestershire sauce. Tartar sauce. Barbecue  sauce. Teriyaki sauce. Soy sauce, including reduced-sodium. Steak sauce. Fish sauce. Oyster sauce. Cocktail sauce. Horseradish that you find on the shelf. Regular ketchup and mustard. Meat flavorings and tenderizers. Bouillon cubes. Hot sauce and Tabasco sauce. Premade or packaged marinades. Premade or packaged taco seasonings. Relishes. Regular salad dressings. Salsa. Potato and tortilla chips. Corn chips and puffs. Salted popcorn and pretzels. Canned or dried soups. Pizza. Frozen entrees and pot pies. Summary  Eating less sodium can help lower your blood pressure, reduce swelling, and protect your heart, liver, and kidneys.  Most people on this plan should limit their sodium intake to 1,500-2,000 mg (milligrams) of sodium each day.  Canned, boxed, and frozen foods are high in sodium. Restaurant foods, fast foods, and pizza are also very high in sodium. You also get sodium by adding salt to food.  Try to cook at home, eat more fresh fruits and vegetables, and eat less fast food, canned, processed, or prepared foods. This information is not intended to replace advice given to you by your health care provider. Make sure you discuss any questions you have with your health care provider. Document Revised: 05/27/2017 Document Reviewed: 06/07/2016 Elsevier Patient Education  2020 [employer].    Call the [facility] office at [phone] if you have any questions, problems or concerns.

## 2020-05-20 ENCOUNTER — Telehealth: Payer: Self-pay | Admitting: Nurse Practitioner

## 2020-05-20 ENCOUNTER — Other Ambulatory Visit: Payer: Self-pay | Admitting: *Deleted

## 2020-05-20 ENCOUNTER — Telehealth: Payer: Self-pay | Admitting: Interventional Cardiology

## 2020-05-20 LAB — BASIC METABOLIC PANEL
BUN/Creatinine Ratio: 15 (ref 9–20)
BUN: 15 mg/dL (ref 6–24)
CO2: 28 mmol/L (ref 20–29)
Calcium: 9.9 mg/dL (ref 8.7–10.2)
Chloride: 102 mmol/L (ref 96–106)
Creatinine, Ser: 0.98 mg/dL (ref 0.76–1.27)
GFR calc Af Amer: 102 mL/min/{1.73_m2} (ref >59)
GFR calc non Af Amer: 88 mL/min/{1.73_m2} (ref >59)
Glucose: 103 mg/dL — ABNORMAL HIGH (ref 65–99)
Potassium: 4 mmol/L (ref 3.5–5.2)
Sodium: 141 mmol/L (ref 134–144)

## 2020-05-20 MED ORDER — SPIRONOLACTONE 25 MG PO TABS: 25.0000 mg | ORAL_TABLET | Freq: Every day | ORAL | 3 refills | Status: DC

## 2020-05-20 NOTE — Telephone Encounter (Signed)
*  STAT* If patient is at the pharmacy, call can be transferred to refill team.   1. Which medications need to be refilled? (please list name of each medication and dose if known) spironolactone (ALDACTONE) 25 MG tablet   2. Which pharmacy/location (including street and city if local pharmacy) is medication to be sent to?  [id number] - [place], [place] - [address]    3. Do they need a 30 day or 90 day supply? 90  

## 2020-05-20 NOTE — Telephone Encounter (Signed)
Dr. Blair Heys called and requested that Norma Fredrickson call him in regards to the appointment the patient had with her on 05/19/20. Dr. Randel Books office can be reached at [phone]. He states he thinks it is a little too much to pass a long in a message and will wait for a phone call from [place] to discuss.

## 2020-05-30 ENCOUNTER — Other Ambulatory Visit: Payer: BC Managed Care – PPO | Admitting: *Deleted

## 2020-05-30 ENCOUNTER — Other Ambulatory Visit: Payer: Self-pay

## 2020-05-30 DIAGNOSIS — I10 Essential (primary) hypertension: Secondary | ICD-10-CM

## 2020-05-30 DIAGNOSIS — I517 Cardiomegaly: Secondary | ICD-10-CM

## 2020-05-31 LAB — BASIC METABOLIC PANEL
BUN/Creatinine Ratio: 15 (ref 9–20)
BUN: 16 mg/dL (ref 6–24)
CO2: 26 mmol/L (ref 20–29)
Calcium: 10 mg/dL (ref 8.7–10.2)
Chloride: 98 mmol/L (ref 96–106)
Creatinine, Ser: 1.04 mg/dL (ref 0.76–1.27)
GFR calc Af Amer: 95 mL/min/{1.73_m2} (ref >59)
GFR calc non Af Amer: 82 mL/min/{1.73_m2} (ref >59)
Glucose: 86 mg/dL (ref 65–99)
Potassium: 4.2 mmol/L (ref 3.5–5.2)
Sodium: 140 mmol/L (ref 134–144)

## 2020-06-17 ENCOUNTER — Telehealth: Payer: Self-pay | Admitting: Nurse Practitioner

## 2020-06-17 NOTE — Telephone Encounter (Signed)
Sent mychart message

## 2020-06-17 NOTE — Telephone Encounter (Signed)
Patient calling to speak with Lori's nurse.

## 2020-06-17 NOTE — Telephone Encounter (Signed)
S/w pt Is aware of lab results. Confirmed upcoming appt with Dr. Katrinka Blazing.

## 2020-06-17 NOTE — Telephone Encounter (Signed)
Patient is calling back for results. Please call back 

## 2020-06-30 NOTE — Progress Notes (Signed)
Cardiology Office Note:    Date:  07/04/2020   ID:  BATES COLLINGTON, DOB February 24, 1966, MRN [id number]  PCP:  Gaynelle Arabian, MD  Cardiologist:  Sinclair Grooms, MD   Referring MD: Gaynelle Arabian, MD   Chief Complaint  Patient presents with  . Coronary Artery Disease  . Congestive Heart Failure    History of Present Illness:    Kyle Cross is a 55 y.o. male with a hx of HTN,OSApreviouslyon CPAPwhich later was diagnosed as atypical narcolepsyand hypertensive cardiovascular disease (LVH and LAE on echocardiogram) andno prior documented CAD. He has a chronically abnormal EKG.  Legrand Como is doing well.  He has been placed on armodafinil (armodafinil) by Dr. Elenore Rota.  It does prevent excessive daytime sleepiness.  It is associated with some mild chest tightness.  Because of this he does not use it on the weekend.  He is not short of breath.  He has no lower extremity edema.  On the last office visit, spironolactone 25 mg/day was started.  Low up potassium and creatinine were normal.  These were done on December 3.  Past Medical History:  Diagnosis Date  . Dyspnea   . Fatigue   . GERD (gastroesophageal reflux disease)   . HTN (hypertension)   . Hypertensive cardiovascular disease    a. LVH, LAE noted on ECG  . Hypertrophic cardiomyopathy ([place])    a. noted on LHC on 04/06/14.   Marland Kitchen Neuropathy 2012   felt to be infectious (viral)  . OSA on CPAP     Past Surgical History:  Procedure Laterality Date  . CARDIAC CATHETERIZATION  05/07/2014  . LEFT HEART CATHETERIZATION WITH CORONARY ANGIOGRAM N/A 05/07/2014   Procedure: LEFT HEART CATHETERIZATION WITH CORONARY ANGIOGRAM;  Surgeon: Sinclair Grooms, MD;  [facility]: [facility] CATH LAB;  Service: Cardiovascular;  Laterality: N/A;  . SCROTAL EXPLORATION  Oct 4196   foliculitis  . VASECTOMY  02/1998    Current Medications: Current Meds  Medication Sig  . Armodafinil 50 MG tablet Take 50 mg by mouth daily.   Marland Kitchen aspirin EC 81 MG tablet  Take 81 mg by mouth daily.  Marland Kitchen diltiazem (CARDIZEM CD) 240 MG 24 hr capsule TAKE 1 CAPSULE BY MOUTH EVERY DAY FOR HEART AND BLOOD PRESSURE  . losartan-hydrochlorothiazide (HYZAAR) 50-12.5 MG tablet Take 1 tablet by mouth daily.  . Multiple Vitamin (MULTIVITAMIN WITH MINERALS) TABS tablet Take 1 tablet by mouth daily.  . sildenafil (REVATIO) 20 MG tablet Take 2-5 tablets by mouth as directed. 30-60 mins before sex  . spironolactone (ALDACTONE) 25 MG tablet Take 1 tablet (25 mg total) by mouth daily.     Allergies:   Shrimp [shellfish allergy] and Metoprolol tartrate   Social History   Socioeconomic History  . Marital status: Married    Spouse name: Not on file  . Number of children: Not on file  . Years of education: Not on file  . Highest education level: Not on file  Occupational History  . Not on file  Tobacco Use  . Smoking status: Never Smoker  . Smokeless tobacco: Never Used  Substance and Sexual Activity  . Alcohol use: Yes    Alcohol/week: 1.0 standard drink    Types: 1 Glasses of wine per week  . Drug use: No  . Sexual activity: Yes  Other Topics Concern  . Not on file  Social History Narrative  . Not on file   Social Determinants of Health   Financial Resource Strain:  Not on file  Food Insecurity: Not on file  Transportation Needs: Not on file  Physical Activity: Not on file  Stress: Not on file  Social Connections: Not on file     Family History: The patient's family history includes Diabetes in his father; Hypertension in his mother. There is no history of Heart attack.  ROS:   Please see the history of present illness.    Since starting Aldactone, his blood pressures have been better and he has had no medication side effects.  All other systems reviewed and are negative.  EKGs/Labs/Other Studies Reviewed:    The following studies were reviewed today: ECHOCARDIOGRAM 2020: IMPRESSIONS    1. Left ventricular ejection fraction, by visual estimation,  is 55 to  60%. The left ventricle has normal function. There is severely increased  left ventricular hypertrophy.  2. Left ventricular diastolic parameters are consistent with Grade II  diastolic dysfunction (pseudonormalization).  3. Global right ventricle has normal systolic function.The right  ventricular size is normal. No increase in right ventricular wall  thickness.  4. Left atrial size was normal.  5. Right atrial size was normal.  6. The pericardial effusion is posterior to the left ventricle.  7. Trivial pericardial effusion is present.  8. The mitral valve is normal in structure. Trace mitral valve  regurgitation. No evidence of mitral stenosis.  9. The tricuspid valve is normal in structure. Tricuspid valve  regurgitation is not demonstrated.  10. The aortic valve is tricuspid. Aortic valve regurgitation is not  visualized. Mild aortic valve sclerosis without stenosis.  11. The pulmonic valve was normal in structure. Pulmonic valve  regurgitation is not visualized.  12. The inferior vena cava is normal in size with greater than 50%  respiratory variability, suggesting right atrial pressure of 3 mmHg.   EKG:  EKG performed on April 28, 2020 demonstrates left ventricular hypertrophy with strain.  Prominent T wave inversion raises a question of hypertrophic cardiomyopathy, genetic.  Unchanged from prior.  Recent Labs: 05/30/2020: BUN 16; Creatinine, Ser 1.04; Potassium 4.2; Sodium 140  Recent Lipid Panel    Component Value Date/Time   CHOL 160 05/07/2014 1507   TRIG 88 05/07/2014 1507   HDL 48 05/07/2014 1507   CHOLHDL 3.3 05/07/2014 1507   VLDL 18 05/07/2014 1507   LDLCALC 94 05/07/2014 1507    Physical Exam:    VS:  BP 138/78   Pulse 69   Ht 5\' 8"  (1.727 m)   Wt 289 lb 12.8 oz (131.5 kg)   SpO2 98%   BMI 44.06 kg/m     Wt Readings from Last 3 Encounters:  07/04/20 289 lb 12.8 oz (131.5 kg)  05/19/20 287 lb (130.2 kg)  04/25/20 286 lb (129.7 kg)      GEN: This. No acute distress HEENT: Normal NECK: No JVD. LYMPHATICS: No lymphadenopathy CARDIAC: No murmur. RRR S4 but no S3 gallop, or edema. VASCULAR:  Normal Pulses. No bruits. RESPIRATORY:  Clear to auscultation without rales, wheezing or rhonchi  ABDOMEN: Soft, non-tender, non-distended, No pulsatile mass, MUSCULOSKELETAL: No deformity  SKIN: Warm and dry NEUROLOGIC:  Alert and oriented x 3 PSYCHIATRIC:  Normal affect   ASSESSMENT:    1. Hypertension, essential   2. OSA on CPAP   3. LVH (left ventricular hypertrophy)   4. Erectile dysfunction, unspecified erectile dysfunction type   5. Educated about COVID-19 virus infection    PLAN:    In order of problems listed above:  1. Blood pressure is  now at target 130/80 or less millimeters mercury.  Low-salt diet encouraged. 2. Compliance with CPAP encouraged. 3. We discussed LVH and impact of blood pressure lowering on progression. 4. Did not discuss 5. Vaccinated and boosted.   Medication Adjustments/Labs and Tests Ordered: Current medicines are reviewed at length with the patient today.  Concerns regarding medicines are outlined above.  Orders Placed This Encounter  Procedures  . Basic metabolic panel   No orders of the defined types were placed in this encounter.   Patient Instructions  Medication Instructions:  Your physician recommends that you continue on your current medications as directed. Please refer to the Current Medication list given to you today.  *If you need a refill on your cardiac medications before your next appointment, please call your pharmacy*   Lab Work: BMET in March  If you have labs (blood work) drawn today and your tests are completely normal, you will receive your results only by: Marland Kitchen MyChart Message (if you have MyChart) OR . A paper copy in the mail If you have any lab test that is abnormal or we need to change your treatment, we will call you to review the  results.   Testing/Procedures: None   Follow-Up: At [facility], you and your health needs are our priority.  As part of our continuing mission to provide you with exceptional heart care, we have created designated Provider Care Teams.  These Care Teams include your primary Cardiologist (physician) and Advanced Practice Providers (APPs -  Physician Assistants and Nurse Practitioners) who all work together to provide you with the care you need, when you need it.  We recommend signing up for the patient portal called "MyChart".  Sign up information is provided on this After Visit Summary.  MyChart is used to connect with patients for Virtual Visits (Telemedicine).  Patients are able to view lab/test results, encounter notes, upcoming appointments, etc.  Non-urgent messages can be sent to your provider as well.   To learn more about what you can do with MyChart, go to [email or web address].    Your next appointment:   8 month(s)  The format for your next appointment:   In Person  Provider:   You may see Lesleigh Noe, MD or one of the following Advanced Practice Providers on your designated Care Team:    Norma Fredrickson, NP  Nada Boozer, NP  Georgie Chard, NP    Other Instructions      Signed, Lesleigh Noe, MD  07/04/2020 4:32 PM    [facility]

## 2020-07-04 ENCOUNTER — Encounter: Payer: Self-pay | Admitting: Interventional Cardiology

## 2020-07-04 ENCOUNTER — Ambulatory Visit (INDEPENDENT_AMBULATORY_CARE_PROVIDER_SITE_OTHER): Payer: BC Managed Care – PPO | Admitting: Interventional Cardiology

## 2020-07-04 ENCOUNTER — Other Ambulatory Visit: Payer: Self-pay

## 2020-07-04 VITALS — BP 138/78 | HR 69 | Ht 68.0 in | Wt 289.8 lb

## 2020-07-04 DIAGNOSIS — G4733 Obstructive sleep apnea (adult) (pediatric): Secondary | ICD-10-CM

## 2020-07-04 DIAGNOSIS — I517 Cardiomegaly: Secondary | ICD-10-CM

## 2020-07-04 DIAGNOSIS — I10 Essential (primary) hypertension: Secondary | ICD-10-CM | POA: Diagnosis not present

## 2020-07-04 DIAGNOSIS — Z9989 Dependence on other enabling machines and devices: Secondary | ICD-10-CM

## 2020-07-04 DIAGNOSIS — N529 Male erectile dysfunction, unspecified: Secondary | ICD-10-CM

## 2020-07-04 DIAGNOSIS — Z7189 Other specified counseling: Secondary | ICD-10-CM

## 2020-07-04 NOTE — Patient Instructions (Signed)
Medication Instructions:  Your physician recommends that you continue on your current medications as directed. Please refer to the Current Medication list given to you today.  *If you need a refill on your cardiac medications before your next appointment, please call your pharmacy*   Lab Work: BMET in March  If you have labs (blood work) drawn today and your tests are completely normal, you will receive your results only by: Marland Kitchen MyChart Message (if you have MyChart) OR . A paper copy in the mail If you have any lab test that is abnormal or we need to change your treatment, we will call you to review the results.   Testing/Procedures: None   Follow-Up: At [facility], you and your health needs are our priority.  As part of our continuing mission to provide you with exceptional heart care, we have created designated Provider Care Teams.  These Care Teams include your primary Cardiologist (physician) and Advanced Practice Providers (APPs -  Physician Assistants and Nurse Practitioners) who all work together to provide you with the care you need, when you need it.  We recommend signing up for the patient portal called "MyChart".  Sign up information is provided on this After Visit Summary.  MyChart is used to connect with patients for Virtual Visits (Telemedicine).  Patients are able to view lab/test results, encounter notes, upcoming appointments, etc.  Non-urgent messages can be sent to your provider as well.   To learn more about what you can do with MyChart, go to [email or web address].    Your next appointment:   8 month(s)  The format for your next appointment:   In Person  Provider:   You may see Lesleigh Noe, MD or one of the following Advanced Practice Providers on your designated Care Team:    Norma Fredrickson, NP  Nada Boozer, NP  Georgie Chard, NP    Other Instructions

## 2020-07-10 ENCOUNTER — Other Ambulatory Visit: Payer: Self-pay | Admitting: Interventional Cardiology

## 2020-08-25 ENCOUNTER — Telehealth: Payer: Self-pay

## 2020-08-25 MED ORDER — LOSARTAN POTASSIUM 50 MG PO TABS: 50.0000 mg | ORAL_TABLET | Freq: Every day | ORAL | 1 refills | Status: DC

## 2020-08-25 MED ORDER — HYDROCHLOROTHIAZIDE 12.5 MG PO CAPS: 12.5000 mg | ORAL_CAPSULE | Freq: Every day | ORAL | 1 refills | Status: DC

## 2020-08-25 NOTE — Telephone Encounter (Signed)
[facility] combo medication losartan[facility]HCTZ in on back order and would like to get separate medications for losartan 50 mg tablets and HCTZ 12.5 mg tablets. Would Dr. Katrinka Blazing like to prescribe these medications, so pt will be able to get medicine? Please address

## 2020-08-25 NOTE — Telephone Encounter (Signed)
Ok to separate.  Prescriptions sent.

## 2020-08-26 ENCOUNTER — Other Ambulatory Visit: Payer: Self-pay | Admitting: Family Medicine

## 2020-08-26 ENCOUNTER — Ambulatory Visit
Admission: RE | Admit: 2020-08-26 | Discharge: 2020-08-26 | Disposition: A | Discharge status: Home / Self Care | Payer: BC Managed Care – PPO | Source: Ambulatory Visit | Attending: Family Medicine | Admitting: Family Medicine

## 2020-08-26 DIAGNOSIS — R0789 Other chest pain: Secondary | ICD-10-CM

## 2020-08-29 ENCOUNTER — Other Ambulatory Visit: Payer: Self-pay | Admitting: Family Medicine

## 2020-08-29 DIAGNOSIS — R911 Solitary pulmonary nodule: Secondary | ICD-10-CM

## 2020-09-05 ENCOUNTER — Other Ambulatory Visit: Payer: BC Managed Care – PPO

## 2020-09-16 ENCOUNTER — Ambulatory Visit
Admission: RE | Admit: 2020-09-16 | Discharge: 2020-09-16 | Disposition: A | Discharge status: Home / Self Care | Payer: BC Managed Care – PPO | Source: Ambulatory Visit | Attending: Family Medicine | Admitting: Family Medicine

## 2020-09-16 DIAGNOSIS — R911 Solitary pulmonary nodule: Secondary | ICD-10-CM

## 2020-09-16 MED ORDER — IOPAMIDOL (ISOVUE-300) INJECTION 61%
75.0000 mL | Freq: Once | INTRAVENOUS | Status: AC | PRN
  Administered 2020-09-16: 75 mL via INTRAVENOUS

## 2021-01-08 ENCOUNTER — Telehealth: Payer: Self-pay | Admitting: Interventional Cardiology

## 2021-01-08 DIAGNOSIS — I10 Essential (primary) hypertension: Secondary | ICD-10-CM

## 2021-01-08 NOTE — Telephone Encounter (Signed)
Ew Message:    Pt said he received a call a couple of days ago about coming in for lab work. I do not see an order for lab work.

## 2021-01-08 NOTE — Telephone Encounter (Signed)
Left message for patient to call back  

## 2021-01-09 NOTE — Telephone Encounter (Signed)
Left message for pt to call back and schedule lab appt.  Pt needs BMET due to being on Spironolactone.  Order in.

## 2021-01-16 ENCOUNTER — Encounter: Payer: Self-pay | Admitting: *Deleted

## 2021-01-16 NOTE — Telephone Encounter (Signed)
My Chart message sent

## 2021-01-29 NOTE — Telephone Encounter (Signed)
Left message to call back  

## 2021-02-05 ENCOUNTER — Other Ambulatory Visit: Payer: Self-pay

## 2021-02-05 ENCOUNTER — Other Ambulatory Visit: Payer: BC Managed Care – PPO | Admitting: *Deleted

## 2021-02-05 DIAGNOSIS — I10 Essential (primary) hypertension: Secondary | ICD-10-CM

## 2021-02-06 LAB — BASIC METABOLIC PANEL
BUN/Creatinine Ratio: 17 (ref 9–20)
BUN: 16 mg/dL (ref 6–24)
CO2: 24 mmol/L (ref 20–29)
Calcium: 9.4 mg/dL (ref 8.7–10.2)
Chloride: 103 mmol/L (ref 96–106)
Creatinine, Ser: 0.92 mg/dL (ref 0.76–1.27)
Glucose: 90 mg/dL (ref 65–99)
Potassium: 4.3 mmol/L (ref 3.5–5.2)
Sodium: 141 mmol/L (ref 134–144)
eGFR: 99 mL/min/{1.73_m2} (ref >59)

## 2021-05-06 ENCOUNTER — Telehealth: Payer: Self-pay | Admitting: Interventional Cardiology

## 2021-05-06 NOTE — Telephone Encounter (Signed)
Pt states that he has noticed over time that he fatigues more easily and has been having hot flashes.  States when he's in the heat it's much worse and he has to stop working and rest for a bit.  Couldn't work much this summer due to the heat and humidity.  Has intermittent chest discomfort that feels like twitches.  Pressure/discomfort usually occurs with exertion, specifically mentioned when he's walking from the parking lot into the building at work.  States it's a long walk.  Did not have any BP or HR readings.  Also complains of erectile dysfunction that he felt was related to his Diltiazem because he thought it was a beta blocker.  Explained that it's not and usually don't have issues with impotence with Diltiazem.  Pt went on vacation this summer and skipped his Diltiazem for 3 days and states he felt much better off of it.  Scheduled pt to be seen in the [place] office on 11/23 (this was the soonest pt was available to come in).  Advised I will send message to Dr. Katrinka Blazing for review.

## 2021-05-06 NOTE — Telephone Encounter (Signed)
Pt c/o medication issue:  1. Name of Medication: diltiazem (CARDIZEM CD) 240 MG 24 hr capsule  2. How are you currently taking this medication (dosage and times per day)? As directed  3. Are you having a reaction (difficulty breathing--STAT)?   4. What is your medication issue? Patient said he gets fatigued and overheated very easily. It is also affecting him sexually, which is frustrating for him.   He wanted to know if there were any alternatives to this medication

## 2021-05-07 NOTE — Telephone Encounter (Signed)
This is reasonable recommendation.  Had normal cath in 2015.  He has hypertensive heart disease.  The fact that he is having chest pain does require that he be evaluated.  ----- Message -----  From: Julio Sicks, RN  Sent: 05/06/2021   4:57 PM EST  To: Lyn Records, MD, Julio Sicks, RN    Spoke with pt and made him aware of information from Dr. Katrinka Blazing.  Pt appreciative for call.

## 2021-05-19 DIAGNOSIS — G4711 Idiopathic hypersomnia with long sleep time: Secondary | ICD-10-CM | POA: Diagnosis not present

## 2021-05-19 DIAGNOSIS — G4733 Obstructive sleep apnea (adult) (pediatric): Secondary | ICD-10-CM | POA: Diagnosis not present

## 2021-05-20 ENCOUNTER — Other Ambulatory Visit: Payer: Self-pay

## 2021-05-20 ENCOUNTER — Encounter: Payer: Self-pay | Admitting: Nurse Practitioner

## 2021-05-20 ENCOUNTER — Ambulatory Visit (INDEPENDENT_AMBULATORY_CARE_PROVIDER_SITE_OTHER): Payer: BC Managed Care – PPO | Admitting: Nurse Practitioner

## 2021-05-20 VITALS — BP 138/78 | HR 76 | Ht 68.0 in | Wt 289.0 lb

## 2021-05-20 DIAGNOSIS — N529 Male erectile dysfunction, unspecified: Secondary | ICD-10-CM

## 2021-05-20 DIAGNOSIS — G4733 Obstructive sleep apnea (adult) (pediatric): Secondary | ICD-10-CM | POA: Diagnosis not present

## 2021-05-20 DIAGNOSIS — I119 Hypertensive heart disease without heart failure: Secondary | ICD-10-CM | POA: Diagnosis not present

## 2021-05-20 DIAGNOSIS — Z01812 Encounter for preprocedural laboratory examination: Secondary | ICD-10-CM

## 2021-05-20 DIAGNOSIS — R079 Chest pain, unspecified: Secondary | ICD-10-CM | POA: Diagnosis not present

## 2021-05-20 DIAGNOSIS — I200 Unstable angina: Secondary | ICD-10-CM | POA: Diagnosis not present

## 2021-05-20 MED ORDER — METOPROLOL TARTRATE 100 MG PO TABS: ORAL_TABLET | ORAL | 0 refills | Status: DC

## 2021-05-20 MED ORDER — DILTIAZEM HCL ER BEADS 300 MG PO CP24: 300.0000 mg | ORAL_CAPSULE | Freq: Every day | ORAL | 1 refills | Status: DC

## 2021-05-20 NOTE — Progress Notes (Signed)
Office Visit    Patient Name: Kyle Cross Date of Encounter: 05/20/2021  Primary Care Provider:  Blair Heys, MD Primary Cardiologist:  Lesleigh Noe, MD  Chief Complaint    55 year old male with a history of hypertensive heart disease, sleep apnea, GERD, and narcolepsy, who presents for follow-up related to hypertension and exertional chest pain.  Past Medical History    Past Medical History:  Diagnosis Date   Dyspnea    Fatigue    GERD (gastroesophageal reflux disease)    History of cardiac catheterization    a. 04/2014 Cath: Nl cors. EF 70%.   HTN (hypertension)    Hypertensive cardiovascular disease    a. 04/2014 Cath: Nl cors. EF 70%. 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 11/2015 cMRI: Limited imaging due to claustrophobia. EF 61%, mod conc LVH. No late gad images obatined, but findings felt to be more consistent w/ HTN heart dzs, no HCM; c. 04/2019 Echo: EF 55-60%, sev LVH, GrII DD, nl RV fxn, triv effusion, trace MR, mild Ao sclerosis.   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)   OSA on CPAP    Past Surgical History:  Procedure Laterality Date   CARDIAC CATHETERIZATION  05/07/2014   LEFT HEART CATHETERIZATION WITH CORONARY ANGIOGRAM N/A 05/07/2014   Procedure: LEFT HEART CATHETERIZATION WITH CORONARY ANGIOGRAM;  Surgeon: Lesleigh Noe, MD;  Location: [facility] CATH LAB;  Service: Cardiovascular;  Laterality: N/A;   SCROTAL EXPLORATION  Oct 2012   foliculitis   VASECTOMY  02/1998    Allergies  Allergies  Allergen Reactions   Shrimp [Shellfish Allergy] Anaphylaxis   Metoprolol Tartrate Other (See Comments)    Fatigue and erectile dysfunction    Shellfish-Derived Products     Other reaction(s): swelling to feet    History of Present Illness    55 year old male with the above past medical history including hypertensive heart disease, sleep apnea, GERD, and narcolepsy.  In November 2015, he was admitted for  chest pain and abnormal EKG.  Echocardiogram showed normal LV function with LVH.  Diagnostic catheterization revealed normal coronary arteries with an EF of 70%.  He had a left ventricular systolic pressure of 156 mmHg and aortic pressure of 143 mmHg, and findings were felt to be consistent with apical hypertrophic cardiomyopathy.  He was medically managed.  A cardiac MRI was attempted in June 2017 however, images were limited secondary to severe claustrophobia requiring termination of the study.  EF was 61% with moderate concentric LVH.  Late gadolinium images were not acquired however it was felt the findings were more consistent with hypertensive heart disease rather than hypertrophic cardiomyopathy.  In August 2017, in the setting of chest pain, stress testing was undertaken and showed a hypertensive response to exercise but no evidence of ischemia or infarct.  Mr. Dittus was last seen in cardiology clinic in January, at which time he was doing reasonably well though noted occasional exertional tightness in his chest, which he attributed to using armodafinil for narcolepsy.  Over this past summer, he was experiencing fairly regular exertional chest tightness when working outside in the heat.  He notes that he felt as though he would overheat easily.  Symptoms improved with rest and he notes that he stopped doing many of the things he might normally do throughout the summer such as yard work.  As weather is cold, symptoms have improved dramatically.  He was out in the yard  this morning without symptoms or limitations and notes that he could easily walk 100 yards without difficulty but fears that if he had to push a lawnmower that distance, that he might become tired and or have chest tightness.  He does experience dyspnea on exertion which he feels is stable.  He denies palpitations, PND, orthopnea, dizziness, syncope, or early satiety.  He sometimes notes a small amount of swelling over his sock line at the end  of a long workday.  He is also bothered by erectile dysfunction and though he has a prescription for sildenafil, he has not been using it out of concern that it might cause tightness in his chest.  Home Medications    Current Outpatient Medications  Medication Sig Dispense Refill   Armodafinil 50 MG tablet Take 50 mg by mouth daily.      aspirin EC 81 MG tablet Take 81 mg by mouth daily.     diltiazem (TIAZAC) 300 MG 24 hr capsule Take 1 capsule (300 mg total) by mouth daily. 90 capsule 1   hydrochlorothiazide (MICROZIDE) 12.5 MG capsule Take 1 capsule (12.5 mg total) by mouth daily. (Patient taking differently: Take 12.5 mg by mouth as needed.) 90 capsule 1   metoprolol tartrate (LOPRESSOR) 100 MG tablet Take 1 tablet (100 mg) by mouth 2 hours prior to your Cardiac CT x 1 dose 1 tablet 0   montelukast (SINGULAIR) 10 MG tablet daily.     Multiple Vitamin (MULTIVITAMIN WITH MINERALS) TABS tablet Take 1 tablet by mouth daily.     sildenafil (REVATIO) 20 MG tablet Take 2-5 tablets by mouth as directed. 30-60 mins before sex  12   spironolactone (ALDACTONE) 25 MG tablet Take 1 tablet (25 mg total) by mouth daily. 90 tablet 3   losartan (COZAAR) 50 MG tablet Take 1 tablet (50 mg total) by mouth daily. (Patient not taking: Reported on 05/20/2021) 90 tablet 1   No current facility-administered medications for this visit.     Review of Systems    Exertional chest tightness over the summer, which has improved as the weather is improved.  He has erectile dysfunction.  He sometimes notes edema over his sock line.  He has chronic dyspnea on exertion.  He denies palpitations, PND, orthopnea, dizziness, syncope, or early satiety.  All other systems reviewed and are otherwise negative except as noted above.    Physical Exam    VS:  BP 138/78 (BP Location: Left Arm, Patient Position: Sitting, Cuff Size: Large)   Pulse 76   Ht 5\' 8"  (1.727 m)   Wt 289 lb (131.1 kg)   SpO2 98%   BMI 43.94 kg/m  , BMI  Body mass index is 43.94 kg/m.     GEN: Obese, in no acute distress. HEENT: normal. Neck: Supple, obese, difficult to gauge JVP.  Carotid bruits, or masses. Cardiac: RRR, no murmurs, rubs, or gallops. No clubbing, cyanosis, edema.  Radials/PT 2+ and equal bilaterally.  Respiratory:  Respirations regular and unlabored, clear to auscultation bilaterally. GI: Obese, soft, nontender, nondistended, BS + x 4. MS: no deformity or atrophy. Skin: warm and dry, no rash. Neuro:  Strength and sensation are intact. Psych: Normal affect.  Accessory Clinical Findings    ECG personally reviewed by me today -sinus rhythm, 76, frequent PACs, inferolateral T wave inversion- no acute changes.  Lab Results  Component Value Date   WBC 5.7 09/20/2017   HGB 13.8 09/20/2017   HCT 41.5 09/20/2017   MCV 83 09/20/2017  PLT 244 09/20/2017   Lab Results  Component Value Date   CREATININE 0.92 02/05/2021   BUN 16 02/05/2021   NA 141 02/05/2021   K 4.3 02/05/2021   CL 103 02/05/2021   CO2 24 02/05/2021   Lab Results  Component Value Date   ALT 21 05/07/2014   AST 19 05/07/2014   ALKPHOS 65 05/07/2014   BILITOT 0.3 05/07/2014   Lab Results  Component Value Date   CHOL 160 05/07/2014   HDL 48 05/07/2014   LDLCALC 94 05/07/2014   TRIG 88 05/07/2014   CHOLHDL 3.3 05/07/2014    Lab Results  Component Value Date   HGBA1C 5.9 (H) 09/20/2017    Assessment & Plan    1.  Unstable angina: Patient with a history of intermittent chest tightness and normal coronary arteries by catheterization 2015.  He had a nonischemic Myoview in 2017.  Over this past summer, in the setting of high heat, he noted exertional chest tightness, dyspnea, and diaphoresis, which prompted him to reduce many of his outdoor activities.  As the weather has cooled, he has been feeling better with more energy and fewer symptoms.  We discussed options for evaluation today.  We agreed to proceed a coronary CT angiogram to reevaluate  his coronary anatomy.  He has a allergy listed to shellfish though not contrast and he has tolerated contrasted studies in the past including catheterization and more recently a CTA of the chest, without the requirement for contrast prophylaxis.  2.  Hypertensive heart disease/LVH: Patient is hypertensive today at 138/78 and he has seen similar pressures were higher at home.  He stopped taking losartan because he felt it was causing cramping.  I am increasing diltiazem CD to 300 mg daily as his hypertensive heart disease and known hypertensive response to exercise, is likely contributing to his symptoms.  Continue spironolactone.  3.  Obstructive sleep apnea: Patient reports compliance with CPAP and just saw his sleep medicine provider yesterday for an adjustment and ordering of the new mask.  4.  Morbid obesity: Pending CTA, we discussed the importance of regular exercise and focus on caloric restriction and weight loss.  5.  Erectile dysfunction: Patient understands that this may be a side effect of his antihypertensive medications.  He has a prescription for sildenafil and in the setting of exertional chest pain at times over the past 4 to 5 months, I discouraged him from using this until we have completed his ischemic evaluation.  6.  Disposition: Follow-up coronary CT angiography with clinic follow-up in a month or sooner if necessary.  Nicolasa Ducking, NP 05/20/2021, 5:21 PM

## 2021-05-20 NOTE — Patient Instructions (Addendum)
Medication Instructions:  - Your physician has recommended you make the following change in your medication:   1) INCREASE diltiazem to 300 mg: - take 1 capsule by mouth once daily  *If you need a refill on your cardiac medications before your next appointment, please call your pharmacy*   Lab Work: - Your physician recommends that you have lab work today: BMP  If you have labs (blood work) drawn today and your tests are completely normal, you will receive your results only by: MyChart Message (if you have MyChart) OR A paper copy in the mail If you have any lab test that is abnormal or we need to change your treatment, we will call you to review the results.   Testing/Procedures: - Your physician has requested that you have cardiac CT. Cardiac computed tomography (CT) is a painless test that uses an x-ray machine to take clear, detailed pictures of your heart.      [facility] [address] [place], [place] [address] [phone]   If scheduled at [facility], please arrive 15 mins early for check-in and test prep.  Please follow these instructions carefully (unless otherwise directed):  Hold all erectile dysfunction medications at least 3 days (72 hrs) prior to test.  On the Night Before the Test: Be sure to Drink plenty of water. Do not consume any caffeinated/decaffeinated beverages or chocolate 12 hours prior to your test. Do not take any antihistamines 12 hours prior to your test.   On the Day of the Test: Drink plenty of water until 1 hour prior to the test. Do not eat any food 4 hours prior to the test. You may take your regular medications prior to the test.  Take metoprolol (Lopressor) 100 mg two hours prior to test x 1 dose. HOLD Hydrochlorothiazide morning of the test.       After the Test: Drink plenty of water. After receiving IV contrast, you may experience a mild flushed  feeling. This is normal. On occasion, you may experience a mild rash up to 24 hours after the test. This is not dangerous. If this occurs, you can take Benadryl 25 mg and increase your fluid intake. If you experience trouble breathing, this can be serious. If it is severe call 911 IMMEDIATELY. If it is mild, please call our office.   Please allow 2-4 weeks for scheduling of routine cardiac CTs. Some insurance companies require a pre-authorization which may delay scheduling of this test.   For non-scheduling related questions, please contact the cardiac imaging nurse navigator should you have any questions/concerns: Rockwell Alexandria, Cardiac Imaging Nurse Navigator Larey Brick, Cardiac Imaging Nurse Navigator [facility] Direct Office Dial: [phone]   For scheduling needs, including cancellations and rescheduling, please call [place], [phone].   Follow-Up: At [facility], you and your health needs are our priority.  As part of our continuing mission to provide you with exceptional heart care, we have created designated Provider Care Teams.  These Care Teams include your primary Cardiologist (physician) and Advanced Practice Providers (APPs -  Physician Assistants and Nurse Practitioners) who all work together to provide you with the care you need, when you need it.  We recommend signing up for the patient portal called "MyChart".  Sign up information is provided on this After Visit Summary.  MyChart is used to connect with patients for Virtual Visits (Telemedicine).  Patients are able to view lab/test results, encounter notes, upcoming  appointments, etc.  Non-urgent messages can be sent to your provider as well.   To learn more about what you can do with MyChart, go to [email or web address].    Your next appointment:   1 month(s)  The format for your next appointment:   In Person  Provider:   Verdis Prime, MD or Nicolasa Ducking, NP     Other  Instructions  Cardiac CT Angiogram A cardiac CT angiogram is a procedure to look at the heart and the area around the heart. It may be done to help find the cause of chest pains or other symptoms of heart disease. During this procedure, a substance called contrast dye is injected into the blood vessels in the area to be checked. A large X-ray machine, called a CT scanner, then takes detailed pictures of the heart and the surrounding area. The procedure is also sometimes called a coronary CT angiogram, coronary artery scanning, or CTA. A cardiac CT angiogram allows the health care provider to see how well blood is flowing to and from the heart. The health care provider will be able to see if there are any problems, such as: Blockage or narrowing of the coronary arteries in the heart. Fluid around the heart. Signs of weakness or disease in the muscles, valves, and tissues of the heart. Tell a health care provider about: Any allergies you have. This is especially important if you have had a previous allergic reaction to contrast dye. All medicines you are taking, including vitamins, herbs, eye drops, creams, and over-the-counter medicines. Any blood disorders you have. Any surgeries you have had. Any medical conditions you have. Whether you are pregnant or may be pregnant. Any anxiety disorders, chronic pain, or other conditions you have that may increase your stress or prevent you from lying still. What are the risks? Generally, this is a safe procedure. However, problems may occur, including: Bleeding. Infection. Allergic reactions to medicines or dyes. Damage to other structures or organs. Kidney damage from the contrast dye that is used. Increased risk of cancer from radiation exposure. This risk is low. Talk with your health care provider about: The risks and benefits of testing. How you can receive the lowest dose of radiation. What happens before the procedure? Wear comfortable  clothing and remove any jewelry, glasses, dentures, and hearing aids. Follow instructions from your health care provider about eating and drinking. This may include: For 12 hours before the procedure -- avoid caffeine. This includes tea, coffee, soda, energy drinks, and diet pills. Drink plenty of water or other fluids that do not have caffeine in them. Being well hydrated can prevent complications. For 4-6 hours before the procedure -- stop eating and drinking. The contrast dye can cause nausea, but this is less likely if your stomach is empty. Ask your health care provider about changing or stopping your regular medicines. This is especially important if you are taking diabetes medicines, blood thinners, or medicines to treat problems with erections (erectile dysfunction). What happens during the procedure?  Hair on your chest may need to be removed so that small sticky patches called electrodes can be placed on your chest. These will transmit information that helps to monitor your heart during the procedure. An IV will be inserted into one of your veins. You might be given a medicine to control your heart rate during the procedure. This will help to ensure that good images are obtained. You will be asked to lie on an exam table. This table will slide  in and out of the CT machine during the procedure. Contrast dye will be injected into the IV. You might feel warm, or you may get a metallic taste in your mouth. You will be given a medicine called nitroglycerin. This will relax or dilate the arteries in your heart. The table that you are lying on will move into the CT machine tunnel for the scan. The person running the machine will give you instructions while the scans are being done. You may be asked to: Keep your arms above your head. Hold your breath. Stay very still, even if the table is moving. When the scanning is complete, you will be moved out of the machine. The IV will be removed. The  procedure may vary among health care providers and hospitals. What can I expect after the procedure? After your procedure, it is common to have: A metallic taste in your mouth from the contrast dye. A feeling of warmth. A headache from the nitroglycerin. Follow these instructions at home: Take over-the-counter and prescription medicines only as told by your health care provider. If you are told, drink enough fluid to keep your urine pale yellow. This will help to flush the contrast dye out of your body. Most people can return to their normal activities right after the procedure. Ask your health care provider what activities are safe for you. It is up to you to get the results of your procedure. Ask your health care provider, or the department that is doing the procedure, when your results will be ready. Keep all follow-up visits as told by your health care provider. This is important. Contact a health care provider if: You have any symptoms of allergy to the contrast dye. These include: Shortness of breath. Rash or hives. A racing heartbeat. Summary A cardiac CT angiogram is a procedure to look at the heart and the area around the heart. It may be done to help find the cause of chest pains or other symptoms of heart disease. During this procedure, a large X-ray machine, called a CT scanner, takes detailed pictures of the heart and the surrounding area after a contrast dye has been injected into blood vessels in the area. Ask your health care provider about changing or stopping your regular medicines before the procedure. This is especially important if you are taking diabetes medicines, blood thinners, or medicines to treat erectile dysfunction. If you are told, drink enough fluid to keep your urine pale yellow. This will help to flush the contrast dye out of your body. This information is not intended to replace advice given to you by your health care provider. Make sure you discuss any  questions you have with your health care provider. Document Revised: 02/25/2021 Document Reviewed: 02/07/2019 Elsevier Patient Education  2022 [employer].

## 2021-05-21 LAB — BASIC METABOLIC PANEL
BUN/Creatinine Ratio: 12 (ref 9–20)
BUN: 11 mg/dL (ref 6–24)
CO2: 29 mmol/L (ref 20–29)
Calcium: 10 mg/dL (ref 8.7–10.2)
Chloride: 101 mmol/L (ref 96–106)
Creatinine, Ser: 0.94 mg/dL (ref 0.76–1.27)
Glucose: 84 mg/dL (ref 70–99)
Potassium: 4.8 mmol/L (ref 3.5–5.2)
Sodium: 142 mmol/L (ref 134–144)
eGFR: 97 mL/min/{1.73_m2} (ref >59)

## 2021-05-28 NOTE — Addendum Note (Signed)
Addended by: Kendrick Fries on: 05/28/2021 02:36 PM   Modules accepted: Orders

## 2021-06-03 ENCOUNTER — Telehealth (HOSPITAL_COMMUNITY): Payer: Self-pay | Admitting: Emergency Medicine

## 2021-06-03 NOTE — Telephone Encounter (Signed)
Attempted to call patient regarding upcoming cardiac CT appointment. °Left message on voicemail with name and callback number °Maleeha Halls RN Navigator Cardiac Imaging °[facility] °[phone] Office °[phone] Cell ° °

## 2021-06-04 ENCOUNTER — Ambulatory Visit
Admission: RE | Admit: 2021-06-04 | Discharge: 2021-06-04 | Disposition: A | Discharge status: Home / Self Care | Payer: BC Managed Care – PPO | Source: Ambulatory Visit | Attending: Nurse Practitioner | Admitting: Nurse Practitioner

## 2021-06-04 ENCOUNTER — Other Ambulatory Visit: Payer: Self-pay

## 2021-06-04 DIAGNOSIS — I119 Hypertensive heart disease without heart failure: Secondary | ICD-10-CM | POA: Diagnosis not present

## 2021-06-04 MED ORDER — NITROGLYCERIN 0.4 MG SL SUBL
0.8000 mg | SUBLINGUAL_TABLET | Freq: Once | SUBLINGUAL | Status: AC
  Administered 2021-06-04: 0.8 mg via SUBLINGUAL

## 2021-06-04 MED ORDER — IOHEXOL 350 MG/ML SOLN
125.0000 mL | Freq: Once | INTRAVENOUS | Status: AC | PRN
  Administered 2021-06-04: 125 mL via INTRAVENOUS

## 2021-06-04 NOTE — Progress Notes (Signed)
Pt tolerated exam without incident; pt denies lightheadedness or dizziness; pt ambulatory to lobby steady gait noted  

## 2021-06-08 ENCOUNTER — Telehealth: Payer: Self-pay | Admitting: *Deleted

## 2021-06-08 DIAGNOSIS — I2584 Coronary atherosclerosis due to calcified coronary lesion: Secondary | ICD-10-CM

## 2021-06-08 DIAGNOSIS — I2510 Atherosclerotic heart disease of native coronary artery without angina pectoris: Secondary | ICD-10-CM

## 2021-06-08 MED ORDER — ATORVASTATIN CALCIUM 20 MG PO TABS: 20.0000 mg | ORAL_TABLET | Freq: Every day | ORAL | 3 refills | Status: DC

## 2021-06-08 NOTE — Telephone Encounter (Signed)
Reviewed results with patient. He verbalized understanding and was hesitant to start another pill. He inquired what potential side effects he may experience and reports that he already has joint discomfort and pain. Advised that if pain should worsen to let us know then we can review other options. Explained what this medication is for and how it can help to reduce further risk. Reviewed that we would like repeat labs in 6 weeks after starting new medication. He verbalized understanding of our conversation, agreement with plan, and had no further questions at this time.

## 2021-06-08 NOTE — Telephone Encounter (Signed)
-----   Message from Creig Hines, NP sent at 06/08/2021 11:59 AM EST ----- Minimal coronary atherosclerosis/calcium (<25% in LAD) w/ calcium score of 9.2.  Reassuring study.  We must focus efforts on improving modifiable risk factors such as high bp, sleep apnea, and obesity.  Likewise, given mild atherosclerosis, he'd benefit from starting a cholesterol medicine - rec atorvastatin 20mg  daily w/ f/u lipids/lft's in 6 wks.

## 2021-06-11 DIAGNOSIS — G4733 Obstructive sleep apnea (adult) (pediatric): Secondary | ICD-10-CM | POA: Diagnosis not present

## 2021-06-16 NOTE — Progress Notes (Signed)
Cardiology Office Note:    Date:  06/17/2021   ID:  Kyle Cross, DOB Aug 02, 1965, MRN [id number]  PCP:  Blair Heys, MD  Cardiologist:  Lesleigh Noe, MD   Referring MD: Blair Heys, MD   Chief Complaint  Patient presents with   Hypertension    History of Present Illness:    Kyle Cross is a 55 y.o. male with a hx of hypertensive heart disease, sleep apnea, GERD, and narcolepsy, who presents for follow-up related to hypertension and exertional chest pain.    Seen earlier this year by Marvis Repress  He feels like he is doing some better.  He denies orthopnea and PND.  No palpitations or syncope.  There is no peripheral edema.  Difficulty sleeping. 6.  On diltiazem, he has felt better.    Past Medical History:  Diagnosis Date   Dyspnea    Fatigue    GERD (gastroesophageal reflux disease)    History of cardiac catheterization    a. 04/2014 Cath: Nl cors. EF 70%.   HTN (hypertension)    Hypertensive cardiovascular disease    a. 04/2014 Cath: Nl cors. EF 70%. 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 11/2015 cMRI: Limited imaging due to claustrophobia. EF 61%, mod conc LVH. No late gad images obatined, but findings felt to be more consistent w/ HTN heart dzs, no HCM; c. 04/2019 Echo: EF 55-60%, sev LVH, GrII DD, nl RV fxn, triv effusion, trace MR, mild Ao sclerosis.   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)   OSA on CPAP     Past Surgical History:  Procedure Laterality Date   CARDIAC CATHETERIZATION  05/07/2014   LEFT HEART CATHETERIZATION WITH CORONARY ANGIOGRAM N/A 05/07/2014   Procedure: LEFT HEART CATHETERIZATION WITH CORONARY ANGIOGRAM;  Surgeon: Lesleigh Noe, MD;  Location: [facility] CATH LAB;  Service: Cardiovascular;  Laterality: N/A;   SCROTAL EXPLORATION  Oct 2012   foliculitis   VASECTOMY  02/1998    Current Medications: Current Meds  Medication Sig   Armodafinil 50 MG tablet Take 100 mg by mouth as needed.    aspirin EC 81 MG tablet Take 81 mg by mouth daily.   atorvastatin (LIPITOR) 20 MG tablet Take 1 tablet (20 mg total) by mouth daily.   diltiazem (TIAZAC) 300 MG 24 hr capsule Take 1 capsule (300 mg total) by mouth daily.   loratadine (CLARITIN) 10 MG tablet Take 10 mg by mouth as needed.   montelukast (SINGULAIR) 10 MG tablet daily.   Multiple Vitamin (MULTIVITAMIN WITH MINERALS) TABS tablet Take 1 tablet by mouth daily.     Allergies:   Shrimp [shellfish allergy], Metoprolol tartrate, and Shellfish-derived products   Social History   Socioeconomic History   Marital status: Married    Spouse name: Not on file   Number of children: Not on file   Years of education: Not on file   Highest education level: Not on file  Occupational History   Not on file  Tobacco Use   Smoking status: Never   Smokeless tobacco: Never  Substance and Sexual Activity   Alcohol use: Yes    Alcohol/week: 1.0 standard drink    Types: 1 Glasses of wine per week   Drug use: No   Sexual activity: Yes  Other Topics Concern   Not on file  Social History Narrative   Not on file   Social Determinants of Health   Financial Resource  Strain: Not on file  Food Insecurity: Not on file  Transportation Needs: Not on file  Physical Activity: Not on file  Stress: Not on file  Social Connections: Not on file     Family History: The patient's family history includes Diabetes in his father; Hypertension in his mother. There is no history of Heart attack.  ROS:   Please see the history of present illness.    Frequent urination and nocturia are his major complaints.  All other systems reviewed and are negative.  EKGs/Labs/Other Studies Reviewed:    The following studies were reviewed today:  CORONARY CTA 06/04/2021: IMPRESSION: 1. Coronary calcium score of 9.2. This was 75th percentile for age and sex matched control.   2. Normal coronary origin with right dominance.   3. Minimal calcification in the  LAD with no obstruction (<25%).   4. CAD-RADS 1. Minimal non-obstructive CAD (0-24%). Consider non-atherosclerotic causes of chest pain. Consider preventive therapy and risk factor modification.   5. Image quality degraded by obesity related artifacts.  6. Fatty steatosis.    EKG:  EKG not repeated  Recent Labs: 05/20/2021: BUN 11; Creatinine, Ser 0.94; Potassium 4.8; Sodium 142  Recent Lipid Panel    Component Value Date/Time   CHOL 160 05/07/2014 1507   TRIG 88 05/07/2014 1507   HDL 48 05/07/2014 1507   CHOLHDL 3.3 05/07/2014 1507   VLDL 18 05/07/2014 1507   LDLCALC 94 05/07/2014 1507    Physical Exam:    VS:  BP 120/80    Pulse 66    Ht 5\' 8"  (1.727 m)    Wt 296 lb (134.3 kg)    SpO2 99%    BMI 45.01 kg/m     Wt Readings from Last 3 Encounters:  06/17/21 296 lb (134.3 kg)  05/20/21 289 lb (131.1 kg)  07/04/20 289 lb 12.8 oz (131.5 kg)     GEN: Obese with BMI 45. No acute distress HEENT: Normal NECK: No JVD. LYMPHATICS: No lymphadenopathy CARDIAC: 2/6 systolic murmur. RRR S4 gallop, or edema. VASCULAR:  Normal Pulses. No bruits. RESPIRATORY:  Clear to auscultation without rales, wheezing or rhonchi  ABDOMEN: Soft, non-tender, non-distended, No pulsatile mass, MUSCULOSKELETAL: No deformity  SKIN: Warm and dry NEUROLOGIC:  Alert and oriented x 3 PSYCHIATRIC:  Normal affect   ASSESSMENT:    1. Coronary atherosclerosis due to calcified coronary lesion   2. OSA on CPAP   3. Hypertension, essential   4. Morbid obesity (HCC)   5. Uncontrolled narcolepsy   6. LVH (left ventricular hypertrophy)    PLAN:    In order of problems listed above:  Secondary prevention discussed.  Lipids will be measured in February.  Target LDL should be less than 70. Compliant with CPAP.  Has a sleep physician.  Excellent blood pressure control on current therapy. Encouraged aerobic activity Continue to follow with sleep physician Reassess LVH at some point in the  future.   Medication Adjustments/Labs and Tests Ordered: Current medicines are reviewed at length with the patient today.  Concerns regarding medicines are outlined above.  No orders of the defined types were placed in this encounter.  No orders of the defined types were placed in this encounter.   Patient Instructions  Medication Instructions:  Your physician recommends that you continue on your current medications as directed. Please refer to the Current Medication list given to you today.  *If you need a refill on your cardiac medications before your next appointment, please call your pharmacy*  Lab Work: None ordered today, but don't forget to go to the [place] location, in February, for your lab work.  Make sure you are fasting when you go.   If you have labs (blood work) drawn today and your tests are completely normal, you will receive your results only by: MyChart Message (if you have MyChart) OR A paper copy in the mail If you have any lab test that is abnormal or we need to change your treatment, we will call you to review the results.   Testing/Procedures: None ordered   Follow-Up: At [facility], you and your health needs are our priority.  As part of our continuing mission to provide you with exceptional heart care, we have created designated Provider Care Teams.  These Care Teams include your primary Cardiologist (physician) and Advanced Practice Providers (APPs -  Physician Assistants and Nurse Practitioners) who all work together to provide you with the care you need, when you need it.  We recommend signing up for the patient portal called "MyChart".  Sign up information is provided on this After Visit Summary.  MyChart is used to connect with patients for Virtual Visits (Telemedicine).  Patients are able to view lab/test results, encounter notes, upcoming appointments, etc.  Non-urgent messages can be sent to your provider as well.   To learn more about what  you can do with MyChart, go to [email or web address].    Your next appointment:   12 month(s)  The format for your next appointment:   In Person  Provider:   Lesleigh Noe, MD     Other Instructions    Signed, Lesleigh Noe, MD  06/17/2021 11:24 AM    [facility]

## 2021-06-17 ENCOUNTER — Ambulatory Visit (INDEPENDENT_AMBULATORY_CARE_PROVIDER_SITE_OTHER): Payer: BC Managed Care – PPO | Admitting: Interventional Cardiology

## 2021-06-17 ENCOUNTER — Encounter: Payer: Self-pay | Admitting: Interventional Cardiology

## 2021-06-17 ENCOUNTER — Other Ambulatory Visit: Payer: Self-pay

## 2021-06-17 VITALS — BP 120/80 | HR 66 | Ht 68.0 in | Wt 296.0 lb

## 2021-06-17 DIAGNOSIS — I517 Cardiomegaly: Secondary | ICD-10-CM

## 2021-06-17 DIAGNOSIS — I2510 Atherosclerotic heart disease of native coronary artery without angina pectoris: Secondary | ICD-10-CM | POA: Diagnosis not present

## 2021-06-17 DIAGNOSIS — I10 Essential (primary) hypertension: Secondary | ICD-10-CM | POA: Diagnosis not present

## 2021-06-17 DIAGNOSIS — G4733 Obstructive sleep apnea (adult) (pediatric): Secondary | ICD-10-CM | POA: Diagnosis not present

## 2021-06-17 DIAGNOSIS — G47419 Narcolepsy without cataplexy: Secondary | ICD-10-CM

## 2021-06-17 DIAGNOSIS — I2584 Coronary atherosclerosis due to calcified coronary lesion: Secondary | ICD-10-CM

## 2021-06-17 DIAGNOSIS — Z9989 Dependence on other enabling machines and devices: Secondary | ICD-10-CM

## 2021-06-17 NOTE — Patient Instructions (Addendum)
Medication Instructions:  Your physician recommends that you continue on your current medications as directed. Please refer to the Current Medication list given to you today.  *If you need a refill on your cardiac medications before your next appointment, please call your pharmacy*   Lab Work: None ordered today, but don't forget to go to the [place] location, in February, for your lab work.  Make sure you are fasting when you go.   If you have labs (blood work) drawn today and your tests are completely normal, you will receive your results only by: MyChart Message (if you have MyChart) OR A paper copy in the mail If you have any lab test that is abnormal or we need to change your treatment, we will call you to review the results.   Testing/Procedures: None ordered   Follow-Up: At [facility], you and your health needs are our priority.  As part of our continuing mission to provide you with exceptional heart care, we have created designated Provider Care Teams.  These Care Teams include your primary Cardiologist (physician) and Advanced Practice Providers (APPs -  Physician Assistants and Nurse Practitioners) who all work together to provide you with the care you need, when you need it.  We recommend signing up for the patient portal called "MyChart".  Sign up information is provided on this After Visit Summary.  MyChart is used to connect with patients for Virtual Visits (Telemedicine).  Patients are able to view lab/test results, encounter notes, upcoming appointments, etc.  Non-urgent messages can be sent to your provider as well.   To learn more about what you can do with MyChart, go to [email or web address].    Your next appointment:   12 month(s)  The format for your next appointment:   In Person  Provider:   Lesleigh Noe, MD     Other Instructions

## 2021-07-12 DIAGNOSIS — G4733 Obstructive sleep apnea (adult) (pediatric): Secondary | ICD-10-CM | POA: Diagnosis not present

## 2021-08-06 ENCOUNTER — Encounter: Payer: Self-pay | Admitting: Interventional Cardiology

## 2021-08-07 ENCOUNTER — Other Ambulatory Visit: Payer: Self-pay

## 2021-08-07 ENCOUNTER — Other Ambulatory Visit: Payer: Self-pay | Admitting: Nurse Practitioner

## 2021-08-07 DIAGNOSIS — I2510 Atherosclerotic heart disease of native coronary artery without angina pectoris: Secondary | ICD-10-CM

## 2021-08-12 DIAGNOSIS — G4733 Obstructive sleep apnea (adult) (pediatric): Secondary | ICD-10-CM | POA: Diagnosis not present

## 2021-08-13 ENCOUNTER — Telehealth: Payer: Self-pay | Admitting: *Deleted

## 2021-08-13 LAB — LIPID PANEL
Chol/HDL Ratio: 3.4 ratio (ref 0.0–5.0)
Cholesterol, Total: 177 mg/dL (ref 100–199)
HDL: 52 mg/dL (ref >39)
LDL Chol Calc (NIH): 114 mg/dL — ABNORMAL HIGH (ref 0–99)
Triglycerides: 55 mg/dL (ref 0–149)
VLDL Cholesterol Cal: 11 mg/dL (ref 5–40)

## 2021-08-13 LAB — HEPATIC FUNCTION PANEL
ALT: 22 IU/L (ref 0–44)
AST: 22 IU/L (ref 0–40)
Albumin: 4.7 g/dL (ref 3.8–4.9)
Alkaline Phosphatase: 70 IU/L (ref 44–121)
Bilirubin Total: 0.2 mg/dL (ref 0.0–1.2)
Bilirubin, Direct: <0.1 mg/dL (ref 0.00–0.40)
Total Protein: 7.2 g/dL (ref 6.0–8.5)

## 2021-08-13 NOTE — Telephone Encounter (Signed)
-----   Message from Creig Hines, NP sent at 08/13/2021 11:00 AM EST ----- Normal liver enzymes.  LDL cholesterol is not at goal - currently 114.  Please ensure that he has been taking atorvastatin 20mg .  If so, then I recommend that we increase to 80mg  daily w/ repeat lipids/lfts in 6 wks.

## 2021-08-13 NOTE — Telephone Encounter (Signed)
Left voicemail message to call back for review of results and recommendations.  

## 2021-08-13 NOTE — Telephone Encounter (Signed)
Spoke with patient and reviewed results. Inquired if he was taking the atorvastatin and he stated no. Reviewed importance of taking that medication but he was adamant that he did not want to take medication. Patient stated that he wants to try making some lifestyle changes instead. Advised I would update provider on his decision and if he should have any other recommendations then I would call him back.

## 2021-08-26 DIAGNOSIS — N529 Male erectile dysfunction, unspecified: Secondary | ICD-10-CM | POA: Diagnosis not present

## 2021-08-26 DIAGNOSIS — I10 Essential (primary) hypertension: Secondary | ICD-10-CM | POA: Diagnosis not present

## 2021-08-26 DIAGNOSIS — Z125 Encounter for screening for malignant neoplasm of prostate: Secondary | ICD-10-CM | POA: Diagnosis not present

## 2021-08-26 DIAGNOSIS — G4733 Obstructive sleep apnea (adult) (pediatric): Secondary | ICD-10-CM | POA: Diagnosis not present

## 2021-08-26 DIAGNOSIS — I517 Cardiomegaly: Secondary | ICD-10-CM | POA: Diagnosis not present

## 2021-09-04 ENCOUNTER — Emergency Department: Payer: No Typology Code available for payment source

## 2021-09-04 ENCOUNTER — Encounter: Payer: Self-pay | Admitting: Emergency Medicine

## 2021-09-04 ENCOUNTER — Emergency Department
Admission: EM | Admit: 2021-09-04 | Discharge: 2021-09-04 | Disposition: A | Discharge status: Home / Self Care | Payer: No Typology Code available for payment source | Attending: Emergency Medicine | Admitting: Emergency Medicine

## 2021-09-04 ENCOUNTER — Other Ambulatory Visit: Payer: Self-pay

## 2021-09-04 DIAGNOSIS — I5030 Unspecified diastolic (congestive) heart failure: Secondary | ICD-10-CM | POA: Diagnosis not present

## 2021-09-04 DIAGNOSIS — S8012XA Contusion of left lower leg, initial encounter: Secondary | ICD-10-CM | POA: Diagnosis not present

## 2021-09-04 DIAGNOSIS — M79605 Pain in left leg: Secondary | ICD-10-CM | POA: Insufficient documentation

## 2021-09-04 DIAGNOSIS — S8992XA Unspecified injury of left lower leg, initial encounter: Secondary | ICD-10-CM | POA: Diagnosis present

## 2021-09-04 DIAGNOSIS — I110 Hypertensive heart disease with heart failure: Secondary | ICD-10-CM | POA: Diagnosis not present

## 2021-09-04 DIAGNOSIS — Y990 Civilian activity done for income or pay: Secondary | ICD-10-CM | POA: Insufficient documentation

## 2021-09-04 DIAGNOSIS — T148XXA Other injury of unspecified body region, initial encounter: Secondary | ICD-10-CM

## 2021-09-04 DIAGNOSIS — W19XXXA Unspecified fall, initial encounter: Secondary | ICD-10-CM | POA: Insufficient documentation

## 2021-09-04 NOTE — ED Triage Notes (Signed)
Pt comes into the ED via POV c/o left leg pain.  Pt states he fell at work and he is being sent over by workers comp to complete an x-ray.  Pt in NAD at this time and is ambulatory with steady gait.  ?

## 2021-09-04 NOTE — Discharge Instructions (Signed)
Your exam, XR, and Ultrasounds are normal. There is no evidence of a blood clot. You have a hematoma from the contusion to your leg. Your should apply ice compresses and rest with the leg elevated. Follow-up with your provider or the medical clinic approved by your provider. Take OTC Tylenol and Motrin as needed for pain.  ?

## 2021-09-04 NOTE — ED Provider Notes (Signed)
? ? ?[facility] ?Emergency Department Provider Note ? ? ? ? Event Date/Time  ? First MD Initiated Contact with Patient 09/04/21 1146   ?  (approximate) ? ? ?History  ? ?Leg Pain ? ? ?HPI ? ?Kyle Cross is a 56 y.o. male with a history of cardiomyopathy, diastolic heart failure, morbid obesity, hypertension and OSA, presents to the ED for evaluation of a work-related injury.  Patient reports he fell at work last week, and is being sent over by his employer for evaluation and an x-ray.  Patient describes hitting his lower anterior leg on a stool as he fell forward.  Patient presents in no acute distress.  He reports some moderate anterior shin pain and lower extremity swelling since the incident.  He also notes some swelling to the lower leg from the knee to the ankle.  He denies any other injury related to the incident, and denies chest pain or shortness of breath. ?  ? ? ?Physical Exam  ? ?Triage Vital Signs: ?ED Triage Vitals  ?Enc Vitals Group  ?   BP 09/04/21 1131 127/73  ?   Pulse Rate 09/04/21 1131 60  ?   Resp 09/04/21 1131 17  ?   Temp 09/04/21 1131 98.3 ?F (36.8 ?C)  ?   Temp Source 09/04/21 1131 Oral  ?   SpO2 09/04/21 1131 98 %  ?   Weight 09/04/21 1124 296 lb 4.8 oz (134.4 kg)  ?   Height 09/04/21 1124 5\' 8"  (1.727 m)  ?   Head Circumference --   ?   Peak Flow --   ?   Pain Score 09/04/21 1123 6  ?   Pain Loc --   ?   Pain Edu? --   ?   Excl. in GC? --   ? ? ?Most recent vital signs: ?Vitals:  ? 09/04/21 1131 09/04/21 1423  ?BP: 127/73 130/70  ?Pulse: 60 68  ?Resp: 17 16  ?Temp: 98.3 ?F (36.8 ?C)   ?SpO2: 98% 98%  ? ? ?General Awake, no distress.  ?CV:  Good peripheral perfusion. RRR ?RESP:  Normal effort.  ?MSK:  Left lower extremity with some anterior shin tenderness and resolving bruising noted.  There is also some edema noted to the left leg greater than the right. ? ? ?ED Results / Procedures / Treatments  ? ?Labs ?(all labs ordered are listed, but only abnormal results are  displayed) ?Labs Reviewed - No data to display ? ? ?EKG ? ? ?RADIOLOGY ? ?I personally viewed and evaluated these images as part of my medical decision making, as well as reviewing the written report by the radiologist. ? ?ED Provider Interpretation: no acute findings} ? ?DG Left Tibia/Fibula ? ?IMPRESSION: ?Negative. ?  ?11/04/21 Venous LLE/Limited Soft Tissue ? ?IMPRESSION: ?There is no evidence of deep venous thrombosis in the left lower ?extremity. ?  ?There is 3.9 x 0.8 x 4.2 cm complex fluid collection in the area of ?patient's symptoms in the left lower leg, possibly resolving ?hematoma. Less likely possibility would be infectious process. ? ? ?PROCEDURES: ? ?Critical Care performed: No ? ?Procedures ? ? ?MEDICATIONS ORDERED IN ED: ?Medications - No data to display ? ? ?IMPRESSION / MDM / ASSESSMENT AND PLAN / ED COURSE  ?I reviewed the triage vital signs and the nursing notes. ?             ?               ? ?  Differential diagnosis includes, but is not limited to, hematoma, phlebitis, DVT, contusion, strain, fracture ? ?Patient's diagnosis is consistent with left lower extremity contusion with soft tissue hematoma secondary to trauma.  No x-ray or ultrasound evidence of acute fracture, DVT, or abscess formation.  Patient will be discharged home with instructions to rest with the leg elevated and apply ice to reduce swelling and OTC meds for pain relief. Patient is to follow up with his primary provider or company approved urgent care as needed or otherwise directed. Patient is given ED precautions to return to the ED for any worsening or new symptoms. ? ? ?FINAL CLINICAL IMPRESSION(S) / ED DIAGNOSES  ? ?Final diagnoses:  ?Contusion  ?Contusion of left leg, initial encounter  ?Hematoma  ? ? ? ?Rx / DC Orders  ? ?ED Discharge Orders   ? ? None  ? ?  ? ? ? ?Note:  This document was prepared using Dragon voice recognition software and may include unintentional dictation errors. ? ?  ?Lissa Hoard,  PA-C ?09/06/21 1157 ? ?  ?Jene Every, MD ?09/06/21 1220 ? ?

## 2021-09-04 NOTE — ED Notes (Signed)
Pt reports fall while at work on 08/26/21. Pt C/O left leg pain specifically in the shin. No redness or swelling noted.  ?

## 2021-09-09 ENCOUNTER — Other Ambulatory Visit: Payer: Self-pay

## 2021-09-09 ENCOUNTER — Other Ambulatory Visit (INDEPENDENT_AMBULATORY_CARE_PROVIDER_SITE_OTHER): Payer: BC Managed Care – PPO

## 2021-09-09 ENCOUNTER — Other Ambulatory Visit: Payer: Self-pay | Admitting: *Deleted

## 2021-09-09 DIAGNOSIS — I2584 Coronary atherosclerosis due to calcified coronary lesion: Secondary | ICD-10-CM

## 2021-09-09 DIAGNOSIS — I2510 Atherosclerotic heart disease of native coronary artery without angina pectoris: Secondary | ICD-10-CM | POA: Diagnosis not present

## 2021-09-10 ENCOUNTER — Telehealth: Payer: Self-pay | Admitting: *Deleted

## 2021-09-10 LAB — HEPATIC FUNCTION PANEL
ALT: 17 IU/L (ref 0–44)
AST: 16 IU/L (ref 0–40)
Albumin: 4.5 g/dL (ref 3.8–4.9)
Alkaline Phosphatase: 68 IU/L (ref 44–121)
Bilirubin Total: 0.3 mg/dL (ref 0.0–1.2)
Bilirubin, Direct: <0.1 mg/dL (ref 0.00–0.40)
Total Protein: 7 g/dL (ref 6.0–8.5)

## 2021-09-10 LAB — LIPID PANEL
Chol/HDL Ratio: 3.6 ratio (ref 0.0–5.0)
Cholesterol, Total: 182 mg/dL (ref 100–199)
HDL: 50 mg/dL (ref >39)
LDL Chol Calc (NIH): 120 mg/dL — ABNORMAL HIGH (ref 0–99)
Triglycerides: 62 mg/dL (ref 0–149)
VLDL Cholesterol Cal: 12 mg/dL (ref 5–40)

## 2021-09-10 NOTE — Telephone Encounter (Signed)
-----   Message from Theora Gianotti, NP sent at 09/10/2021  4:33 PM EDT ----- ?LDL cholesterol is higher than it was a month ago.  It appears that his lifestyle modifications were not successful in reducing his lipid numbers.  If he would like to start a statin, we should place him on atorvastatin 40 mg daily w/ f/u lipids/lfts 6 wks later. ?

## 2021-09-10 NOTE — Telephone Encounter (Signed)
Left voicemail message to call back for review of results and recommendations.  

## 2021-09-14 NOTE — Telephone Encounter (Signed)
Results and recommendations reviewed with patient. He does not want to start atorvastatin at this time due to side effects. He wants to do more research on how to get it lower and/or recommendations to an alternative medication. He also states that he currently has been having some problems with his liver and that is another reason he would like to defer medication at this time. Advised I would update provider of his wishes and we can hold off on the repeat labs.  ?

## 2021-10-19 DIAGNOSIS — M542 Cervicalgia: Secondary | ICD-10-CM | POA: Diagnosis not present

## 2021-10-19 DIAGNOSIS — M792 Neuralgia and neuritis, unspecified: Secondary | ICD-10-CM | POA: Diagnosis not present

## 2021-10-19 DIAGNOSIS — M79601 Pain in right arm: Secondary | ICD-10-CM | POA: Diagnosis not present

## 2021-10-21 DIAGNOSIS — M5412 Radiculopathy, cervical region: Secondary | ICD-10-CM | POA: Diagnosis not present

## 2021-10-21 DIAGNOSIS — M799 Soft tissue disorder, unspecified: Secondary | ICD-10-CM | POA: Diagnosis not present

## 2021-10-21 DIAGNOSIS — M542 Cervicalgia: Secondary | ICD-10-CM | POA: Diagnosis not present

## 2021-10-21 DIAGNOSIS — M6281 Muscle weakness (generalized): Secondary | ICD-10-CM | POA: Diagnosis not present

## 2021-10-23 DIAGNOSIS — M799 Soft tissue disorder, unspecified: Secondary | ICD-10-CM | POA: Diagnosis not present

## 2021-10-23 DIAGNOSIS — M542 Cervicalgia: Secondary | ICD-10-CM | POA: Diagnosis not present

## 2021-10-23 DIAGNOSIS — M5412 Radiculopathy, cervical region: Secondary | ICD-10-CM | POA: Diagnosis not present

## 2021-10-23 DIAGNOSIS — M6281 Muscle weakness (generalized): Secondary | ICD-10-CM | POA: Diagnosis not present

## 2021-10-26 DIAGNOSIS — M5412 Radiculopathy, cervical region: Secondary | ICD-10-CM | POA: Diagnosis not present

## 2021-10-26 DIAGNOSIS — M6281 Muscle weakness (generalized): Secondary | ICD-10-CM | POA: Diagnosis not present

## 2021-10-26 DIAGNOSIS — M542 Cervicalgia: Secondary | ICD-10-CM | POA: Diagnosis not present

## 2021-10-26 DIAGNOSIS — M799 Soft tissue disorder, unspecified: Secondary | ICD-10-CM | POA: Diagnosis not present

## 2021-10-28 DIAGNOSIS — M5412 Radiculopathy, cervical region: Secondary | ICD-10-CM | POA: Diagnosis not present

## 2021-10-28 DIAGNOSIS — M6281 Muscle weakness (generalized): Secondary | ICD-10-CM | POA: Diagnosis not present

## 2021-10-28 DIAGNOSIS — M542 Cervicalgia: Secondary | ICD-10-CM | POA: Diagnosis not present

## 2021-10-28 DIAGNOSIS — M799 Soft tissue disorder, unspecified: Secondary | ICD-10-CM | POA: Diagnosis not present

## 2021-11-02 DIAGNOSIS — M792 Neuralgia and neuritis, unspecified: Secondary | ICD-10-CM | POA: Diagnosis not present

## 2021-11-02 DIAGNOSIS — M25511 Pain in right shoulder: Secondary | ICD-10-CM | POA: Diagnosis not present

## 2021-11-03 DIAGNOSIS — M25511 Pain in right shoulder: Secondary | ICD-10-CM | POA: Diagnosis not present

## 2021-11-03 DIAGNOSIS — M542 Cervicalgia: Secondary | ICD-10-CM | POA: Diagnosis not present

## 2021-11-04 DIAGNOSIS — M5412 Radiculopathy, cervical region: Secondary | ICD-10-CM | POA: Diagnosis not present

## 2021-11-04 DIAGNOSIS — M799 Soft tissue disorder, unspecified: Secondary | ICD-10-CM | POA: Diagnosis not present

## 2021-11-04 DIAGNOSIS — M6281 Muscle weakness (generalized): Secondary | ICD-10-CM | POA: Diagnosis not present

## 2021-11-04 DIAGNOSIS — M542 Cervicalgia: Secondary | ICD-10-CM | POA: Diagnosis not present

## 2021-11-13 DIAGNOSIS — M67911 Unspecified disorder of synovium and tendon, right shoulder: Secondary | ICD-10-CM | POA: Diagnosis not present

## 2021-11-13 DIAGNOSIS — M75111 Incomplete rotator cuff tear or rupture of right shoulder, not specified as traumatic: Secondary | ICD-10-CM | POA: Diagnosis not present

## 2021-11-13 DIAGNOSIS — M19011 Primary osteoarthritis, right shoulder: Secondary | ICD-10-CM | POA: Diagnosis not present

## 2021-11-13 DIAGNOSIS — R600 Localized edema: Secondary | ICD-10-CM | POA: Diagnosis not present

## 2021-11-16 DIAGNOSIS — G4711 Idiopathic hypersomnia with long sleep time: Secondary | ICD-10-CM | POA: Diagnosis not present

## 2021-11-16 DIAGNOSIS — G4733 Obstructive sleep apnea (adult) (pediatric): Secondary | ICD-10-CM | POA: Diagnosis not present

## 2021-11-18 DIAGNOSIS — G4733 Obstructive sleep apnea (adult) (pediatric): Secondary | ICD-10-CM | POA: Diagnosis not present

## 2021-11-21 ENCOUNTER — Other Ambulatory Visit: Payer: Self-pay | Admitting: Nurse Practitioner

## 2021-11-23 IMAGING — CT CT CHEST W/ CM
1 series · 15 of 34 positions shown, 19 images · IV contrast (APPLIED)
Comparison: Chest radiograph of 08/26/2020. No prior CT.

CLINICAL DATA: Chest radiograph demonstrating a possible right lung
pulmonary nodule of 6 mm.

EXAM:
CT CHEST WITH CONTRAST
TECHNIQUE: Multidetector CT imaging of the chest was performed during
intravenous contrast administration.
CONTRAST:  75mL [id number] IOPAMIDOL ([id number]) INJECTION 61%

[Series 2: chest w/cm · axial · 0.97mm/px · z∈[-291,-37]mm · 15 of 151 slices shown, 19 images]
[im 12/151  mediastinal]
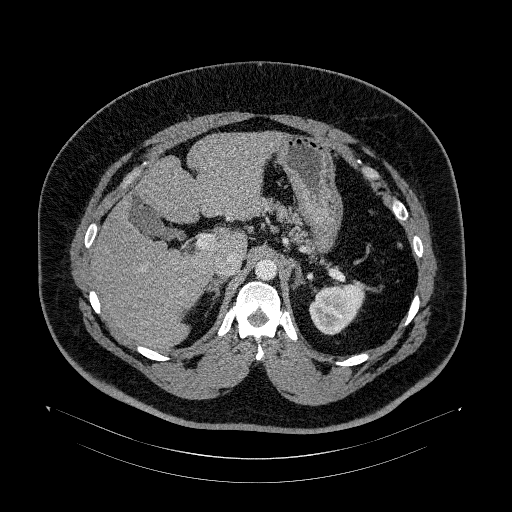
[im 12/151  lung]
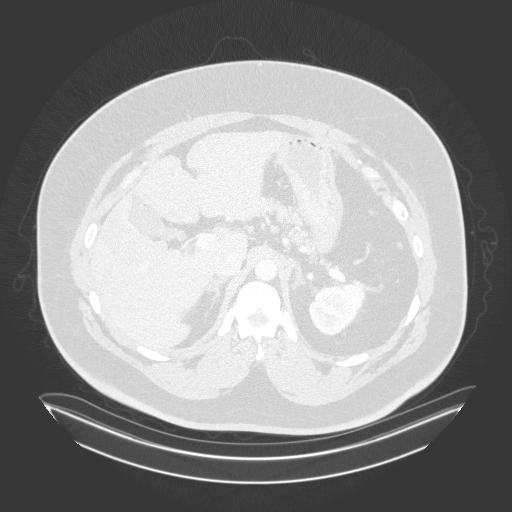
[im 23/151  lung]
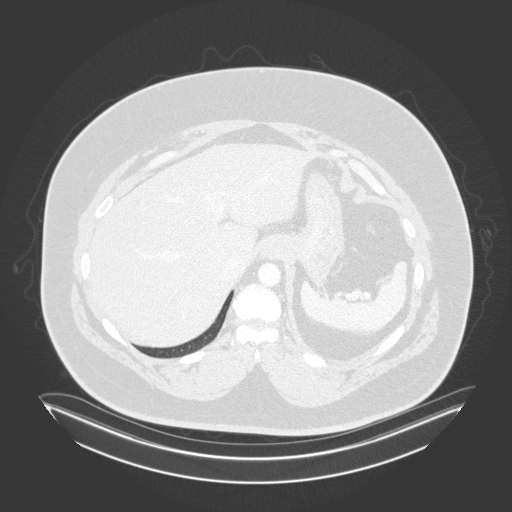
[im 31/151  lung]
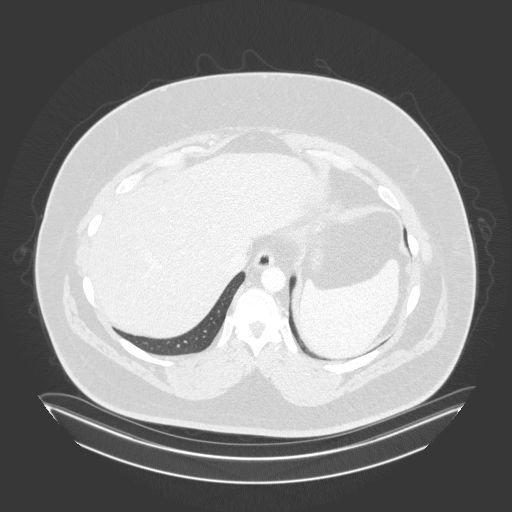
[im 39/151  lung]
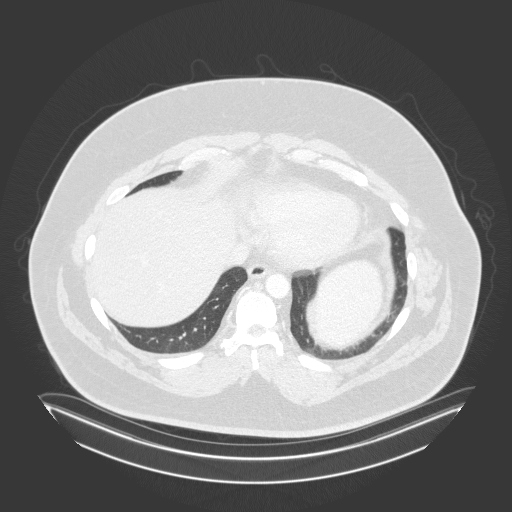
[im 51/151  mediastinal]
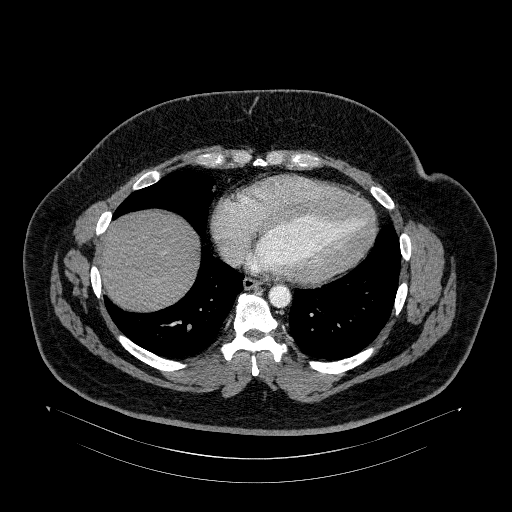
[im 51/151  lung]
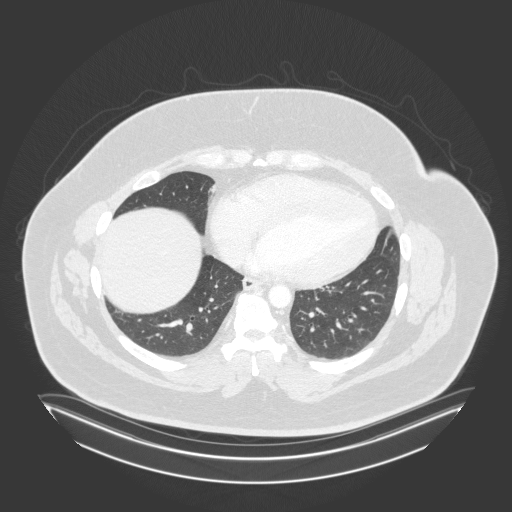
[im 61/151  lung]
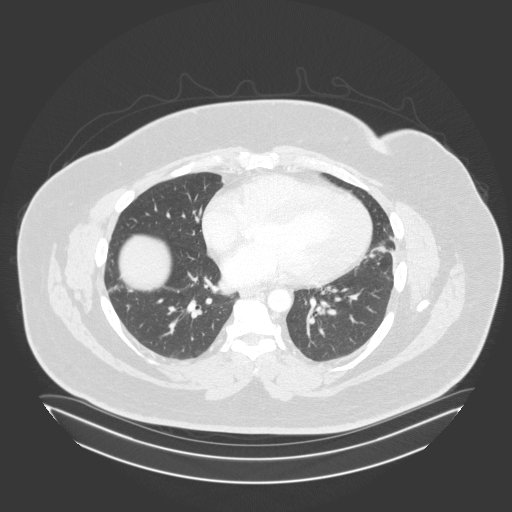
[im 67/151  lung]
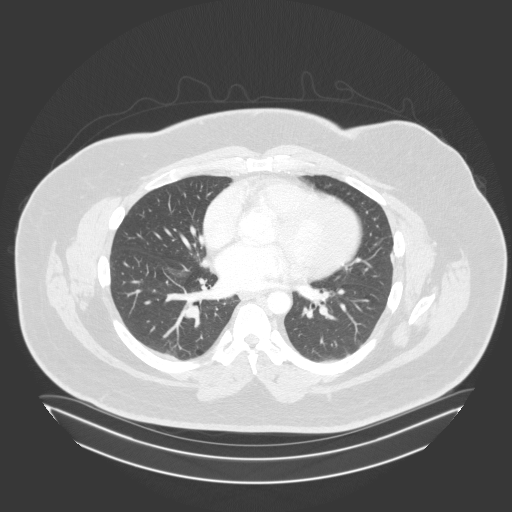
[im 78/151  lung]
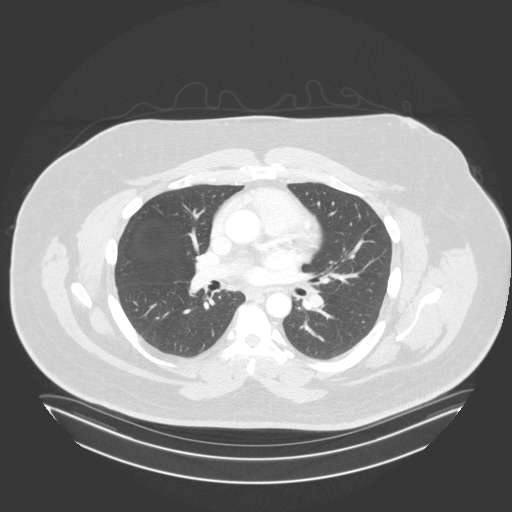
[im 84/151  mediastinal]
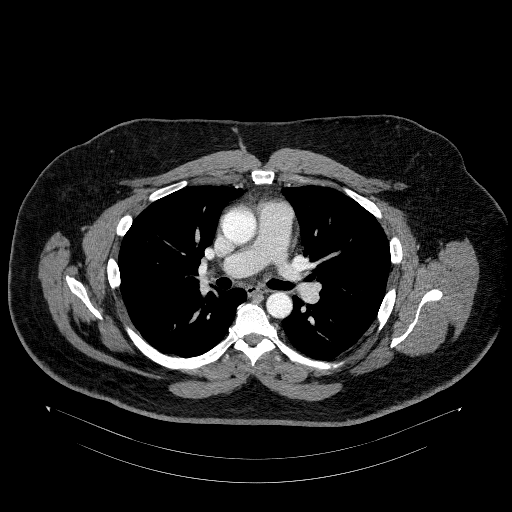
[im 84/151  lung]
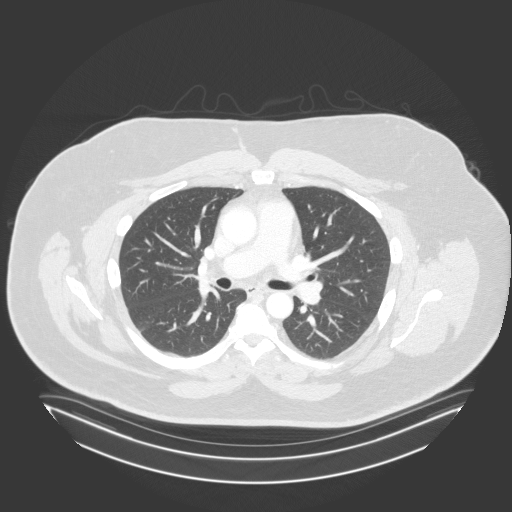
[im 91/151  lung]
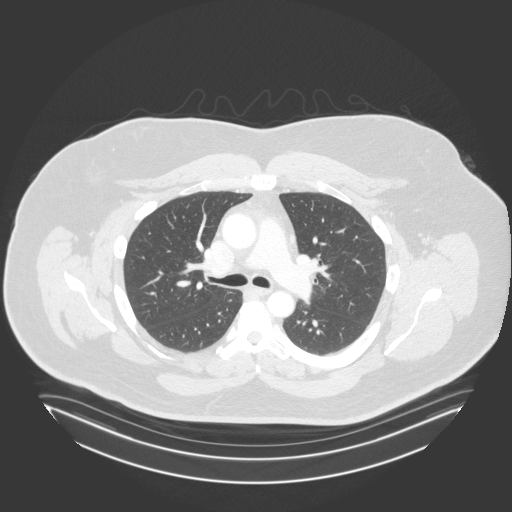
[im 101/151  lung]
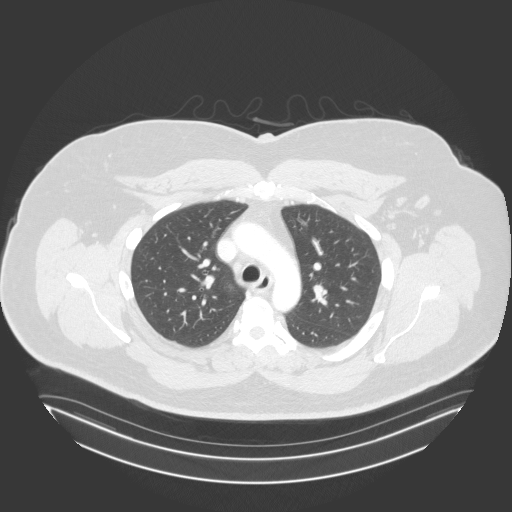
[im 112/151  lung]
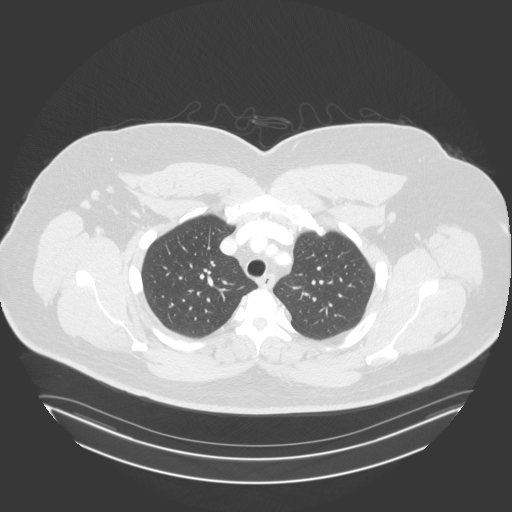
[im 121/151  mediastinal]
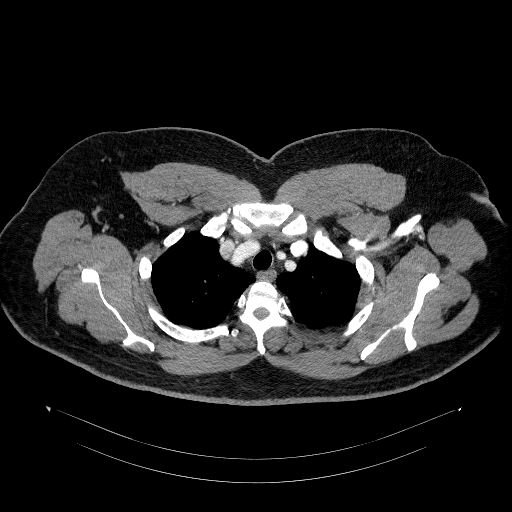
[im 121/151  lung]
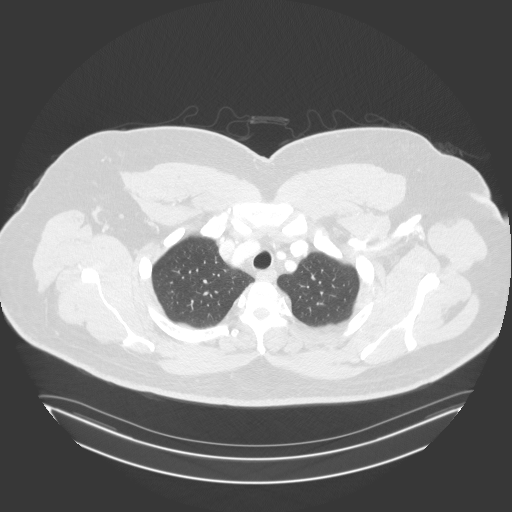
[im 128/151  lung]
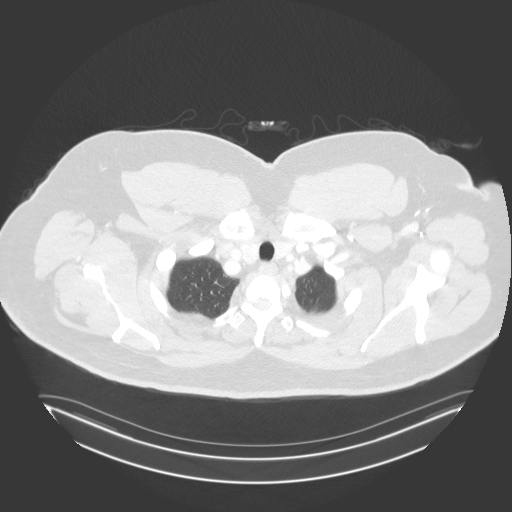
[im 139/151  lung]
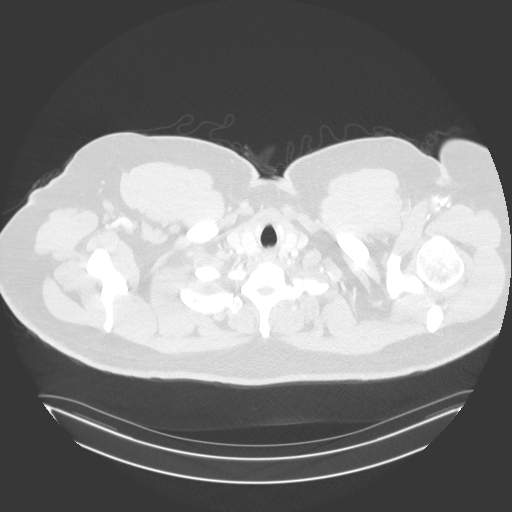

[15 of 34 positions shown; findings below may reference images not displayed]

FINDINGS: Cardiovascular: Normal aortic caliber. Mild cardiomegaly, without
pericardial effusion.

Mediastinum/Nodes: No mediastinal or hilar adenopathy.

Lungs/Pleura: No pleural fluid. No suspicious pulmonary nodule or
correlate for the plain film abnormality. Mild bibasilar scarring.

Upper Abdomen: Normal imaged portions of the liver, spleen, stomach,
pancreas, gallbladder, kidneys. Bilateral adrenal thickening with
mild left adrenal nodularity.

Musculoskeletal: Minimal convex right thoracic spine curvature.
IMPRESSION: 1. No evidence of pulmonary nodule or correlate for the plain film
abnormality, which was likely artifactual or related to osseous
summation shadow.
2.  No acute process in the chest.

## 2021-11-24 DIAGNOSIS — M25511 Pain in right shoulder: Secondary | ICD-10-CM | POA: Diagnosis not present

## 2021-11-24 NOTE — Telephone Encounter (Signed)
Refill request

## 2021-12-19 DIAGNOSIS — G4733 Obstructive sleep apnea (adult) (pediatric): Secondary | ICD-10-CM | POA: Diagnosis not present

## 2021-12-22 DIAGNOSIS — M25511 Pain in right shoulder: Secondary | ICD-10-CM | POA: Diagnosis not present

## 2022-01-18 DIAGNOSIS — G4733 Obstructive sleep apnea (adult) (pediatric): Secondary | ICD-10-CM | POA: Diagnosis not present

## 2022-01-25 DIAGNOSIS — M25511 Pain in right shoulder: Secondary | ICD-10-CM | POA: Diagnosis not present

## 2022-08-26 DIAGNOSIS — N529 Male erectile dysfunction, unspecified: Secondary | ICD-10-CM | POA: Diagnosis not present

## 2022-08-26 DIAGNOSIS — I10 Essential (primary) hypertension: Secondary | ICD-10-CM | POA: Diagnosis not present

## 2022-08-26 DIAGNOSIS — Z125 Encounter for screening for malignant neoplasm of prostate: Secondary | ICD-10-CM | POA: Diagnosis not present

## 2022-08-26 DIAGNOSIS — G4733 Obstructive sleep apnea (adult) (pediatric): Secondary | ICD-10-CM | POA: Diagnosis not present

## 2022-08-26 DIAGNOSIS — I517 Cardiomegaly: Secondary | ICD-10-CM | POA: Diagnosis not present

## 2022-11-11 IMAGING — US US EXTREM LOW VENOUS*L*
1 series · 13 of 24 positions shown · non-contrast
Comparison: None.

CLINICAL DATA: Trauma, pain and swelling

EXAM:
Left LOWER EXTREMITY VENOUS DOPPLER ULTRASOUND
TECHNIQUE: Gray-scale sonography with compression, as well as color and duplex
ultrasound, were performed to evaluate the deep venous system(s)
from the level of the common femoral vein through the popliteal and
proximal calf veins.

[Series 1: us venous img lower uni left (dvt) · portal-venous · 42 acquisitions, 13 frames shown]
[im 1/42]
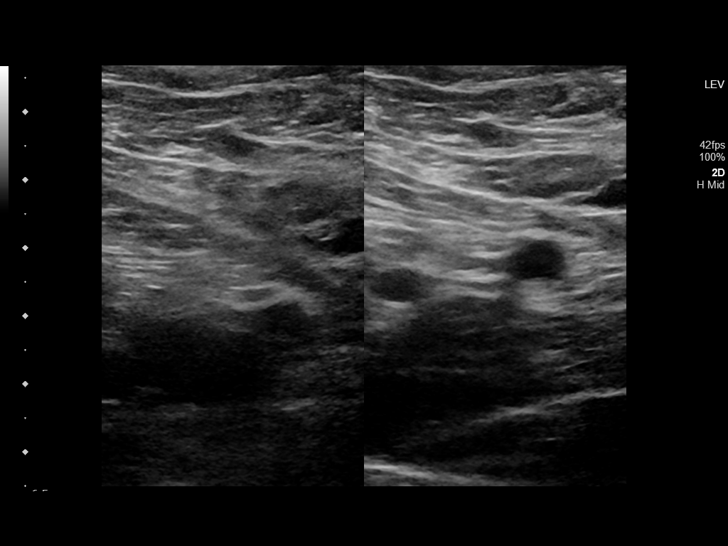
[im 4/42]
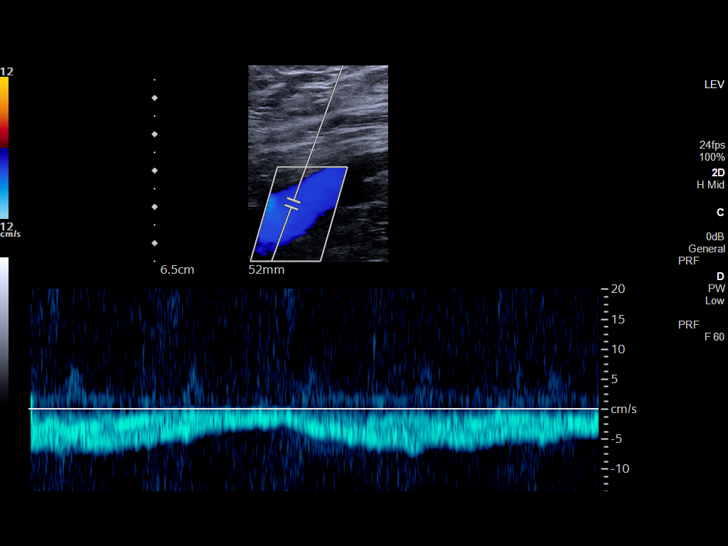
[im 8/42]
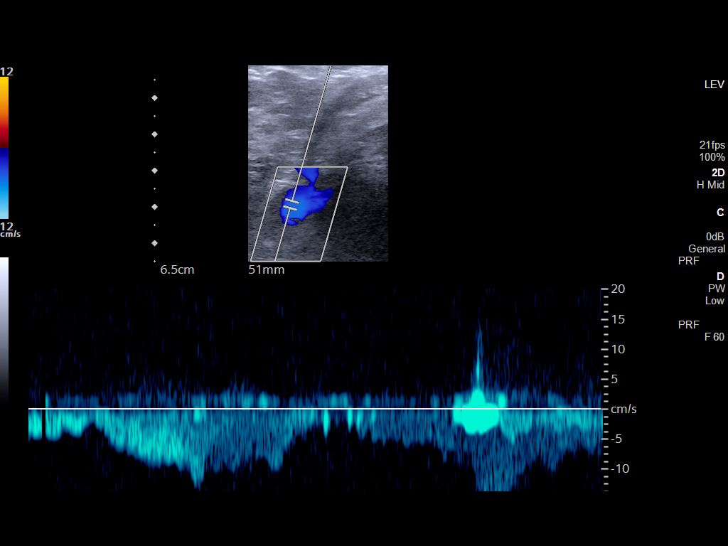
[im 11/42]
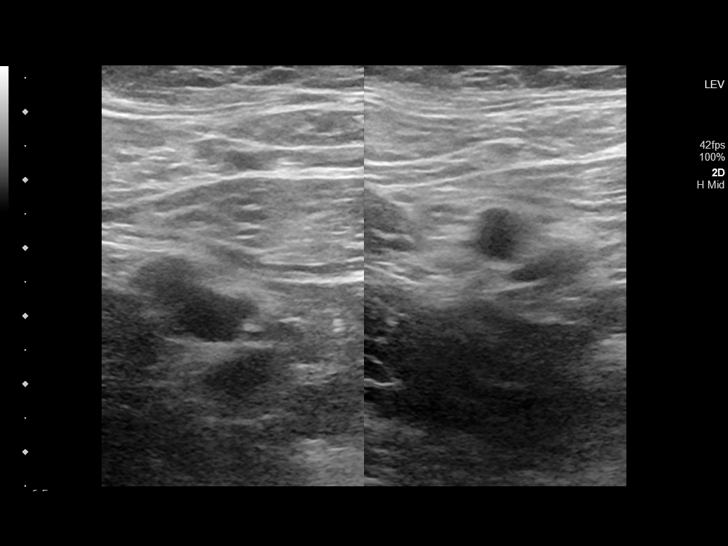
[im 15/42]
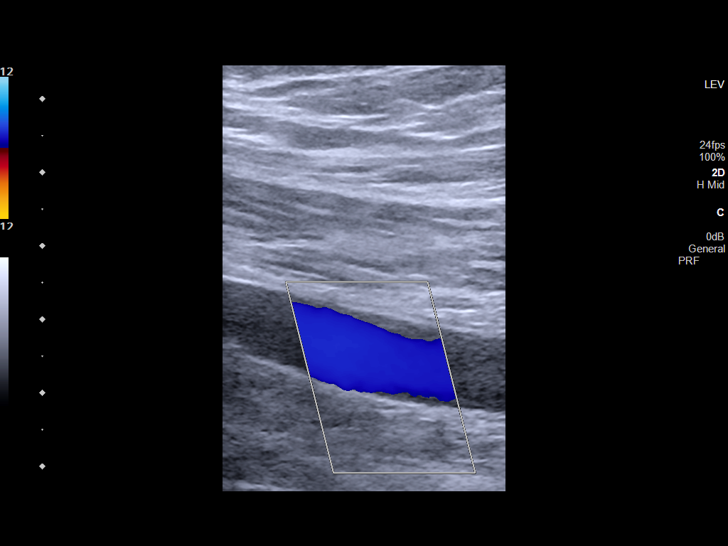
[im 18/42]
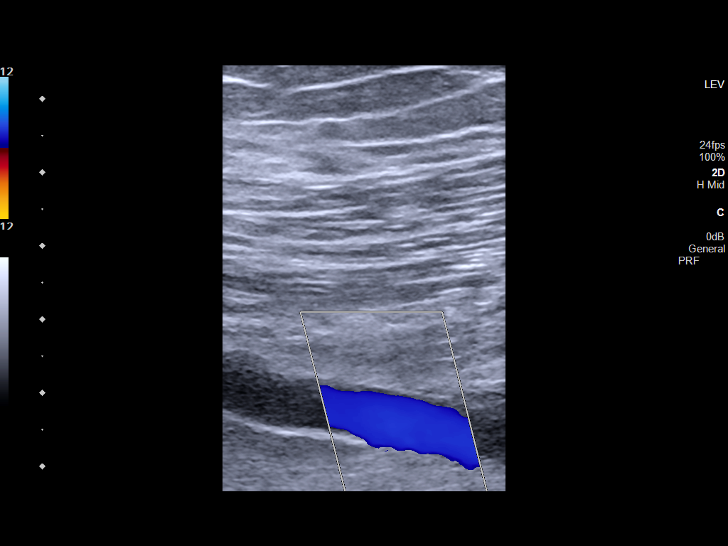
[im 24/42]
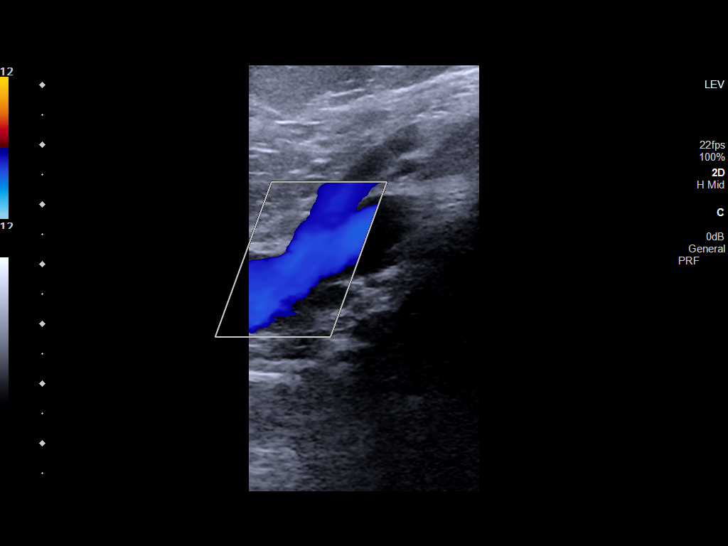
[im 25/42]
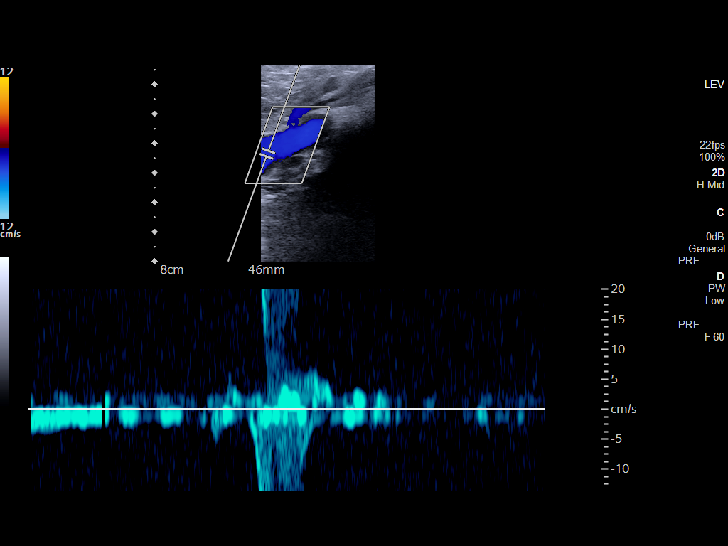
[im 29/42]
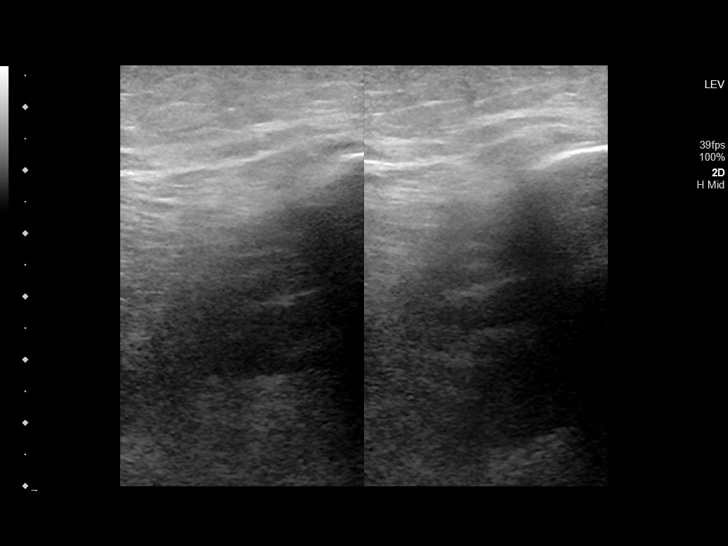
[im 33/42]
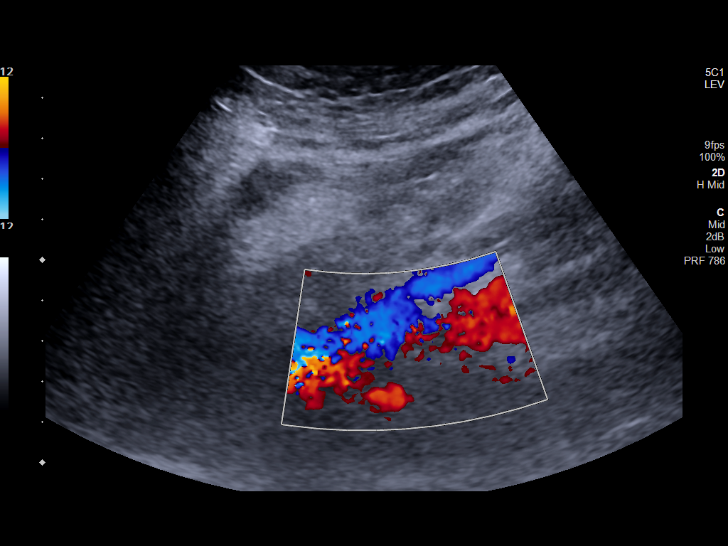
[im 36/42]
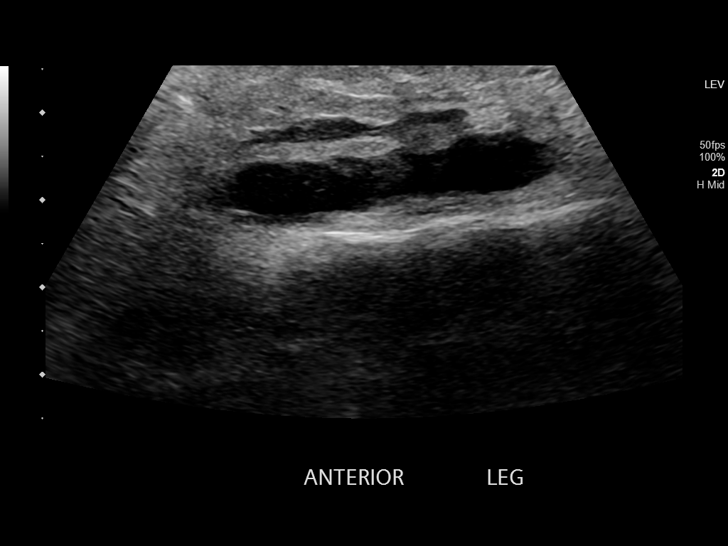
[im 40/42]
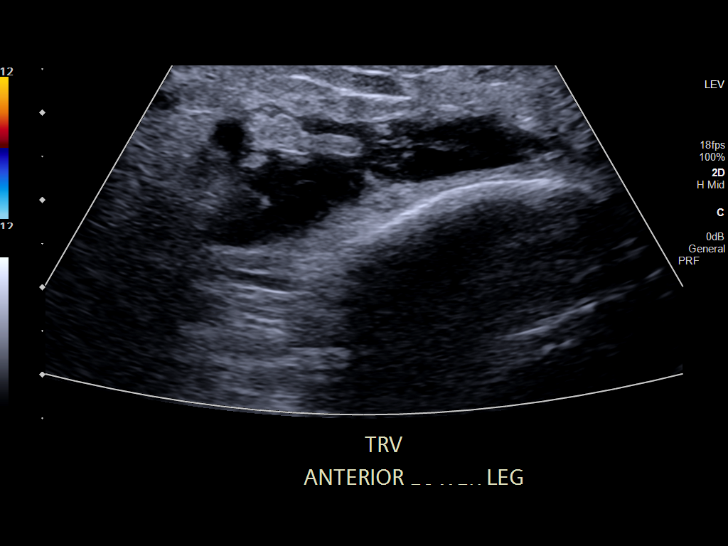
[im 42/42]
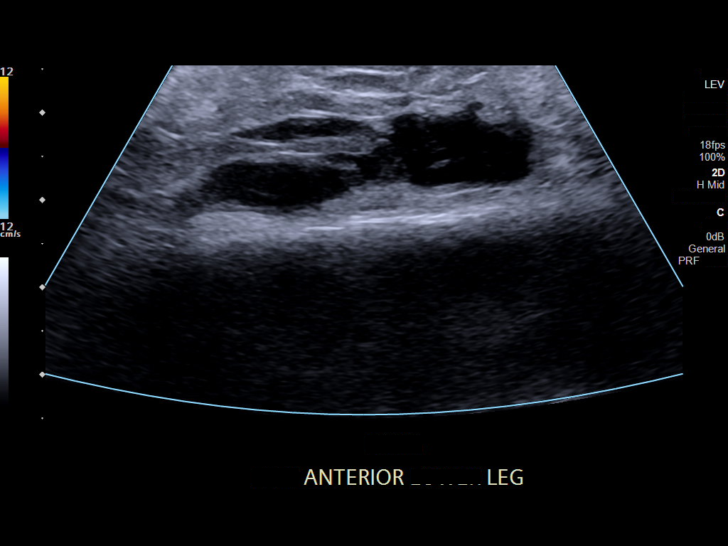

[13 of 24 positions shown; findings below may reference images not displayed]

FINDINGS: VENOUS

Normal compressibility of the common femoral, superficial femoral,
and popliteal veins, as well as the visualized calf veins.
Visualized portions of profunda femoral vein and great saphenous
vein unremarkable. No filling defects to suggest DVT on grayscale or
color Doppler imaging. Doppler waveforms show normal direction of
venous flow, normal respiratory plasticity and response to
augmentation.

Limited views of the contralateral common femoral vein are
unremarkable.

OTHER

There is 3.9 x 0.8 x 4.2 cm complex fluid collection in the
subcutaneous plane in the area of patient's symptoms anterior left
lower leg. There is no demonstrable internal vascularity.

Limitations: none
IMPRESSION: There is no evidence of deep venous thrombosis in the left lower
extremity.

There is 3.9 x 0.8 x 4.2 cm complex fluid collection in the area of
patient's symptoms in the left lower leg, possibly resolving
hematoma. Less likely possibility would be infectious process.

## 2022-11-23 DIAGNOSIS — G4733 Obstructive sleep apnea (adult) (pediatric): Secondary | ICD-10-CM | POA: Diagnosis not present

## 2022-11-23 DIAGNOSIS — I10 Essential (primary) hypertension: Secondary | ICD-10-CM | POA: Diagnosis not present

## 2023-03-31 DIAGNOSIS — Z8042 Family history of malignant neoplasm of prostate: Secondary | ICD-10-CM | POA: Diagnosis not present

## 2023-03-31 DIAGNOSIS — R351 Nocturia: Secondary | ICD-10-CM | POA: Diagnosis not present

## 2023-10-07 ENCOUNTER — Encounter: Payer: Self-pay | Admitting: Urology

## 2023-10-07 ENCOUNTER — Ambulatory Visit: Admitting: Urology

## 2023-10-07 VITALS — BP 152/84 | HR 76

## 2023-10-07 DIAGNOSIS — N401 Enlarged prostate with lower urinary tract symptoms: Secondary | ICD-10-CM | POA: Diagnosis not present

## 2023-10-07 DIAGNOSIS — N529 Male erectile dysfunction, unspecified: Secondary | ICD-10-CM

## 2023-10-07 LAB — URINALYSIS, COMPLETE
Bilirubin, UA: NEGATIVE
Glucose, UA: NEGATIVE
Ketones, UA: NEGATIVE
Leukocytes,UA: NEGATIVE
Nitrite, UA: NEGATIVE
Specific Gravity, UA: 1.025 (ref 1.005–1.030)
Urobilinogen, Ur: 0.2 mg/dL (ref 0.2–1.0)
pH, UA: 6 (ref 5.0–7.5)

## 2023-10-07 LAB — BLADDER SCAN AMB NON-IMAGING: Scan Result: 0

## 2023-10-07 LAB — MICROSCOPIC EXAMINATION

## 2023-10-07 MED ORDER — GEMTESA 75 MG PO TABS: 75.0000 mg | ORAL_TABLET | Freq: Every day | ORAL | Status: AC

## 2023-10-07 MED ORDER — TADALAFIL 20 MG PO TABS: ORAL_TABLET | ORAL | 0 refills | Status: DC

## 2023-10-07 NOTE — Progress Notes (Signed)
 I, [name], acting as a scribe for Geraline Knapp, MD., have documented all relevant documentation on the behalf of Geraline Knapp, MD, as directed by Geraline Knapp, MD while in the presence of Geraline Knapp, MD.  10/07/2023 6:57 PM   Kyle Cross 12-31-1965 [id number]  Referring provider: Elida Grounds, DO [address] [place],  [place] [address]  Chief Complaint  Patient presents with   Establish Care   Benign Prostatic Hypertrophy    HPI: Kyle Cross is a 58 y.o. male referred for BPH with lower urinary tract symptoms.   1 year history of bothersome lower urinary tract symptoms, however they have significantly worsened over the last 3-6 months. His most bothersome symptoms are urinary frequency, urgency, and nocturia x4-5.  During the daytime he has occasional episodes of urge incontinence.  He was prescribed tamsulosin, which he states was not beneficial. He states he took "one half of the bottle" before discontinuing but doesn't remember how many capsules were in the bottle. Denies dysuria or gross hematuria.  No flank, abdominal, or pelvic pain.  Long history of erectile dysfunction. He has tried sildenafil in the past, however had a significant headache and elevated heart rate. Organic risk factors include hypertension, antihypertensive medication, hyperlipidemia. Family history of prostate cancer and PSA 03/2023 was 1.30   PMH: Past Medical History:  Diagnosis Date   Dyspnea    Fatigue    GERD (gastroesophageal reflux disease)    History of cardiac catheterization    a. 04/2014 Cath: Nl cors. EF 70%.   HTN (hypertension)    Hypertensive cardiovascular disease    a. 04/2014 Cath: Nl cors. EF 70%. 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 11/2015 cMRI: Limited imaging due to claustrophobia. EF 61%, mod conc LVH. No late gad images obatined, but findings felt to be more consistent w/ HTN heart dzs, no HCM; c. 04/2019  Echo: EF 55-60%, sev LVH, GrII DD, nl RV fxn, triv effusion, trace MR, mild Ao sclerosis.   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)   OSA on CPAP     Surgical History: Past Surgical History:  Procedure Laterality Date   CARDIAC CATHETERIZATION  05/07/2014   LEFT HEART CATHETERIZATION WITH CORONARY ANGIOGRAM N/A 05/07/2014   Procedure: LEFT HEART CATHETERIZATION WITH CORONARY ANGIOGRAM;  Surgeon: Mickiel Albany, MD;  Location: [facility] CATH LAB;  Service: Cardiovascular;  Laterality: N/A;   SCROTAL EXPLORATION  Oct 2012   foliculitis   VASECTOMY  02/1998    Home Medications:  Allergies as of 10/07/2023       Reactions   Shrimp [shellfish Allergy] Anaphylaxis   Metoprolol Tartrate Other (See Comments)   Fatigue and erectile dysfunction    Shellfish-derived Products    Other reaction(s): swelling to feet        Medication List        Accurate as of October 07, 2023  6:57 PM. If you have any questions, ask your nurse or doctor.          Armodafinil 50 MG tablet Take 100 mg by mouth as needed.   aspirin EC 81 MG tablet Take 81 mg by mouth daily.   atorvastatin 20 MG tablet Commonly known as: LIPITOR Take 1 tablet (20 mg total) by mouth daily.   Gemtesa 75 MG Tabs Generic drug: Vibegron Take 1 tablet (75 mg total) by mouth daily. Started by: Geralyn Knee  C Londyn Wotton   hydrochlorothiazide 12.5 MG capsule Commonly known as: MICROZIDE Take 1 capsule (12.5 mg total) by mouth daily.   loratadine 10 MG tablet Commonly known as: CLARITIN Take 10 mg by mouth as needed.   losartan 50 MG tablet Commonly known as: COZAAR Take 1 tablet (50 mg total) by mouth daily.   metoprolol tartrate 100 MG tablet Commonly known as: LOPRESSOR Take 1 tablet (100 mg) by mouth 2 hours prior to your Cardiac CT x 1 dose   montelukast 10 MG tablet Commonly known as: SINGULAIR daily.   multivitamin with minerals Tabs tablet Take 1 tablet by mouth daily.   sildenafil 20  MG tablet Commonly known as: REVATIO Take 2-5 tablets by mouth as directed. 30-60 mins before sex   spironolactone 25 MG tablet Commonly known as: ALDACTONE Take 1 tablet (25 mg total) by mouth daily.   tadalafil 20 MG tablet Commonly known as: CIALIS 1-2 tabs 1 hour prior to intercourse Started by: Dinisha Cai C Rutherford Alarie   Tiadylt ER 300 MG 24 hr capsule Generic drug: diltiazem TAKE 1 CAPSULE BY MOUTH EVERY DAY        Allergies:  Allergies  Allergen Reactions   Shrimp [Shellfish Allergy] Anaphylaxis   Metoprolol Tartrate Other (See Comments)    Fatigue and erectile dysfunction    Shellfish-Derived Products     Other reaction(s): swelling to feet    Family History: Family History  Problem Relation Age of Onset   Diabetes Father    Hypertension Mother    Heart attack Neg Hx     Social History:  reports that he has never smoked. He has never used smokeless tobacco. He reports current alcohol use of about 1.0 standard drink of alcohol per week. He reports that he does not use drugs.   Physical Exam: BP (!) 152/84   Pulse 76   Constitutional:  Alert and oriented, No acute distress. HEENT: [place] AT. Respiratory: Normal respiratory effort, no increased work of breathing. GU: Prostate 40 grams, smooth without nodules. Psychiatric: Normal mood and affect.   Urinalysis Pending.   Assessment & Plan:    1. BPH with LUTS Primarily storage-related voiding symptoms with urgency, incontinence, and nocturia. No improvement with Tamsulosin. Post-void residual (PVR) today is 0 mL. Trial of Gemtesa 75 mg daily; samples provided. Follow-up in one month for symptom reassessment. If no improvement, consider cystoscopy at that visit.  2. Erectile Dysfunction (ED) Discussed that Tadalafil may have fewer side effects compared to sildenafil. Prescription for Tadalafil 10 mg tablets, 1-2 tablets as needed prior to intercourse.  I have reviewed the above documentation for accuracy and  completeness, and I agree with the above.   Geraline Knapp, MD   [facility] [address], [address] [place], [place] [address] [phone]

## 2023-10-07 NOTE — Patient Instructions (Signed)

## 2023-10-28 ENCOUNTER — Other Ambulatory Visit: Payer: Self-pay | Admitting: Urology

## 2023-11-10 ENCOUNTER — Encounter: Payer: Self-pay | Admitting: Urology

## 2023-11-10 ENCOUNTER — Ambulatory Visit: Admitting: Urology

## 2023-11-10 VITALS — BP 148/77 | HR 74 | Ht 68.0 in

## 2023-11-10 DIAGNOSIS — N5082 Scrotal pain: Secondary | ICD-10-CM | POA: Diagnosis not present

## 2023-11-10 DIAGNOSIS — N401 Enlarged prostate with lower urinary tract symptoms: Secondary | ICD-10-CM

## 2023-11-10 DIAGNOSIS — R3129 Other microscopic hematuria: Secondary | ICD-10-CM

## 2023-11-10 NOTE — Patient Instructions (Addendum)
 Please call [phone] or [phone] to schedule your imaging prior to your appointment.    Cystoscopy Cystoscopy is a procedure that is used to help diagnose and sometimes treat conditions that affect the lower urinary tract. The lower urinary tract includes the bladder and the urethra. The urethra is the tube that drains urine from the bladder. Cystoscopy is done using a thin, tube-shaped instrument with a light and camera at the end (cystoscope). The cystoscope may be hard or flexible, depending on the goal of the procedure. The cystoscope is inserted through the urethra, into the bladder. Cystoscopy may be recommended if you have: Urinary tract infections that keep coming back. Blood in the urine (hematuria). An inability to control when you urinate (urinary incontinence) or an overactive bladder. Unusual cells found in a urine sample. A blockage in the urethra, such as a urinary stone. Painful urination. An abnormality in the bladder found during an intravenous pyelogram (IVP) or CT scan. What are the risks? Generally, this is a safe procedure. However, problems may occur, including: Infection. Bleeding.  What happens during the procedure?  You will be given one or more of the following: A medicine to numb the area (local anesthetic). The area around the opening of your urethra will be cleaned. The cystoscope will be passed through your urethra into your bladder. Germ-free (sterile) fluid will flow through the cystoscope to fill your bladder. The fluid will stretch your bladder so that your health care provider can clearly examine your bladder walls. Your doctor will look at the urethra and bladder. The cystoscope will be removed The procedure may vary among health care providers  What can I expect after the procedure? After the procedure, it is common to have: Some soreness or pain in your urethra. Urinary symptoms. These include: Mild pain or burning when you urinate. Pain  should stop within a few minutes after you urinate. This may last for up to a few days after the procedure. A small amount of blood in your urine for several days. Feeling like you need to urinate but producing only a small amount of urine. Follow these instructions at home: General instructions Return to your normal activities as told by your health care provider.  Drink plenty of fluids after the procedure. Keep all follow-up visits as told by your health care provider. This is important. Contact a health care provider if you: Have pain that gets worse or does not get better with medicine, especially pain when you urinate lasting longer than 72 hours after the procedure. Have trouble urinating. Get help right away if you: Have blood clots in your urine. Have a fever or chills. Are unable to urinate. Summary Cystoscopy is a procedure that is used to help diagnose and sometimes treat conditions that affect the lower urinary tract. Cystoscopy is done using a thin, tube-shaped instrument with a light and camera at the end. After the procedure, it is common to have some soreness or pain in your urethra. It is normal to have blood in your urine after the procedure.  If you were prescribed an antibiotic medicine, take it as told by your health care provider.  This information is not intended to replace advice given to you by your health care provider. Make sure you discuss any questions you have with your health care provider. Document Revised: 06/06/2018 Document Reviewed: 06/06/2018 Elsevier Patient Education  2020 [employer].

## 2023-11-10 NOTE — Progress Notes (Signed)
 I, Kyle Cross, acting as a Neurosurgeon for Kyle Knapp, MD., have documented all relevant documentation on the behalf of Kyle Knapp, MD, as directed by Kyle Knapp, MD while in the presence of Kyle Knapp, MD.  Discussed the use of AI scribe software for clinical note transcription with the patient, who gave verbal consent to proceed.   11/10/2023 11:48 PM   Kyle Cross December 14, 1965 [id number]  Chief Complaint  Patient presents with   Benign Prostatic Hypertrophy    HPI: Kyle Cross is a 58 y.o. male presents for follow-up visit.  Refer to my office note 10/07/23. Urinalysis at that visit did show 3-10 RBCs. Significant improvement in his voiding pattern on Gemtesa , and his most bothersome symptom is nocturia x3-4.  He does have sleep apnea on C-Pap. He recently changed his mask and states his sleep has improved but still with nocturia. No dysuria or gross immaturia Does complain today of chronic scrotal pain. Has a history of epididymitis.    PMH: Past Medical History:  Diagnosis Date   Dyspnea    Fatigue    GERD (gastroesophageal reflux disease)    History of cardiac catheterization    a. 04/2014 Cath: Nl cors. EF 70%.   HTN (hypertension)    Hypertensive cardiovascular disease    a. 04/2014 Cath: Nl cors. EF 70%. 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 11/2015 cMRI: Limited imaging due to claustrophobia. EF 61%, mod conc LVH. No late gad images obatined, but findings felt to be more consistent w/ HTN heart dzs, no HCM; c. 04/2019 Echo: EF 55-60%, sev LVH, GrII DD, nl RV fxn, triv effusion, trace MR, mild Ao sclerosis.   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)   OSA on CPAP     Surgical History: Past Surgical History:  Procedure Laterality Date   CARDIAC CATHETERIZATION  05/07/2014   LEFT HEART CATHETERIZATION WITH CORONARY ANGIOGRAM N/A 05/07/2014   Procedure: LEFT HEART CATHETERIZATION WITH CORONARY  ANGIOGRAM;  Surgeon: Kyle Albany, MD;  Location: [facility] CATH LAB;  Service: Cardiovascular;  Laterality: N/A;   SCROTAL EXPLORATION  Oct 2012   foliculitis   VASECTOMY  02/1998    Home Medications:  Allergies as of 11/10/2023       Reactions   Shrimp [shellfish Allergy] Anaphylaxis   Metoprolol  Tartrate Other (See Comments)   Fatigue and erectile dysfunction    Shellfish-derived Products    Other reaction(s): swelling to feet        Medication List        Accurate as of Nov 10, 2023 11:48 PM. If you have any questions, ask your nurse or doctor.          STOP taking these medications    Armodafinil 50 MG tablet Stopped by: Kyle Cross   aspirin  EC 81 MG tablet Stopped by: Kyle Cross   atorvastatin  20 MG tablet Commonly known as: LIPITOR Stopped by: Kyle Cross   hydrochlorothiazide  12.5 MG capsule Commonly known as: MICROZIDE  Stopped by: Kyle Cross   loratadine  10 MG tablet Commonly known as: CLARITIN  Stopped by: Kyle Cross   losartan  50 MG tablet Commonly known as: COZAAR  Stopped by: Kyle Cross   metoprolol  tartrate 100 MG tablet Commonly known as: LOPRESSOR  Stopped by: [name]   montelukast 10 MG tablet Commonly known as: SINGULAIR Stopped by: Kyle Cross   multivitamin  with minerals Tabs tablet Stopped by: Kyle Cross   spironolactone  25 MG tablet Commonly known as: ALDACTONE  Stopped by: Kyle Cross   Tiadylt  ER 300 MG 24 hr capsule Generic drug: diltiazem  Stopped by: Kyle Cross       TAKE these medications    Gemtesa  75 MG Tabs Generic drug: Vibegron  Take 1 tablet (75 mg total) by mouth daily.   sildenafil 20 MG tablet Commonly known as: REVATIO Take 2-5 tablets by mouth as directed. 30-60 mins before sex   tadalafil  20 MG tablet Commonly known as: CIALIS  1-2 TABS 1 HOUR PRIOR TO INTERCOURSE        Allergies:  Allergies  Allergen Reactions   Shrimp [Shellfish  Allergy] Anaphylaxis   Metoprolol  Tartrate Other (See Comments)    Fatigue and erectile dysfunction    Shellfish-Derived Products     Other reaction(s): swelling to feet    Family History: Family History  Problem Relation Age of Onset   Diabetes Father    Hypertension Mother    Heart attack Neg Hx     Social History:  reports that he has never smoked. He has never used smokeless tobacco. He reports current alcohol use of about 1.0 standard drink of alcohol per week. He reports that he does not use drugs.   Physical Exam: BP (!) 148/77 (BP Location: Left Arm, Patient Position: Sitting, Cuff Size: Large)   Pulse 74   Ht 5\' 8"  (1.727 m)   BMI 45.05 kg/m   Constitutional:  Alert and oriented, No acute distress. HEENT: [place] AT, moist mucus membranes.  Trachea midline, no masses. Cardiovascular: No clubbing, cyanosis, or edema. Respiratory: Normal respiratory effort, no increased work of breathing. GI: Abdomen is soft, nontender, nondistended, no abdominal masses GU: Phallus without lesions. Testes descened bilaterally, mild tenderness superior to the right testis, though epitidomous not palpable. Psychiatric: Normal mood and affect.   Assessment & Plan:    1. Lower Urinary Tract Symptoms No improvement on Tamsulosin or Gemtesa . Scheduled cystoscopy for further evaluation of his outlet.  2. Microhematuria AUA hematuria risk stratification: intermediate. Since he has voiding symptoms and scrotal pain, a CT Urogram is scheduled in lieu of a renal ultrasound; cystoscopy as above.  3. Scrotal Pain Scheduled scrotal ultrasound. Laboratory urinalysis: 1+ blood dipstick, 3-10 RBCs on microscopy.  I have reviewed the above documentation for accuracy and completeness, and I agree with the above.   Kyle Knapp, MD  [facility] [address], [address] [place], [place] [address] [phone]

## 2023-11-11 LAB — URINALYSIS, COMPLETE
Bilirubin, UA: NEGATIVE
Glucose, UA: NEGATIVE
Ketones, UA: NEGATIVE
Leukocytes,UA: NEGATIVE
Nitrite, UA: NEGATIVE
Protein,UA: NEGATIVE
Specific Gravity, UA: 1.025 (ref 1.005–1.030)
Urobilinogen, Ur: 0.2 mg/dL (ref 0.2–1.0)
pH, UA: 6 (ref 5.0–7.5)

## 2023-11-11 LAB — MICROSCOPIC EXAMINATION

## 2023-12-01 ENCOUNTER — Ambulatory Visit
Admission: RE | Admit: 2023-12-01 | Discharge: 2023-12-01 | Disposition: A | Discharge status: Home / Self Care | Source: Ambulatory Visit | Attending: Urology | Admitting: Urology

## 2023-12-01 DIAGNOSIS — N5082 Scrotal pain: Secondary | ICD-10-CM | POA: Diagnosis present

## 2023-12-01 DIAGNOSIS — N401 Enlarged prostate with lower urinary tract symptoms: Secondary | ICD-10-CM

## 2023-12-01 DIAGNOSIS — R3129 Other microscopic hematuria: Secondary | ICD-10-CM

## 2023-12-01 MED ORDER — IOHEXOL 300 MG/ML  SOLN
100.0000 mL | Freq: Once | INTRAMUSCULAR | Status: AC | PRN
  Administered 2023-12-01: 100 mL via INTRAVENOUS

## 2023-12-04 ENCOUNTER — Ambulatory Visit: Payer: Self-pay | Admitting: Urology

## 2023-12-14 ENCOUNTER — Ambulatory Visit: Admitting: Urology

## 2023-12-14 ENCOUNTER — Encounter: Payer: Self-pay | Admitting: Urology

## 2023-12-14 ENCOUNTER — Other Ambulatory Visit: Admitting: Urology

## 2023-12-14 VITALS — BP 173/104 | HR 80 | Ht 68.0 in | Wt 290.0 lb

## 2023-12-14 DIAGNOSIS — N401 Enlarged prostate with lower urinary tract symptoms: Secondary | ICD-10-CM

## 2023-12-14 LAB — URINALYSIS, COMPLETE
Bilirubin, UA: NEGATIVE
Glucose, UA: NEGATIVE
Ketones, UA: NEGATIVE
Leukocytes,UA: NEGATIVE
Nitrite, UA: NEGATIVE
Specific Gravity, UA: 1.02 (ref 1.005–1.030)
Urobilinogen, Ur: 1 mg/dL (ref 0.2–1.0)
pH, UA: 6.5 (ref 5.0–7.5)

## 2023-12-14 LAB — MICROSCOPIC EXAMINATION

## 2023-12-14 NOTE — Progress Notes (Unsigned)
   12/14/23  CC:  Chief Complaint  Patient presents with   Cysto    HPI: Refer to my prior note 11/10/2023.  CT urogram with a punctate nonobstructing left upper pole renal calculus.  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  UA today 3-10 RBC  Blood pressure (!) 173/104, pulse 80, height 5' 8 (1.727 m), weight 290 lb (131.5 kg).   Cystoscopy Procedure Note  Patient identification was confirmed, informed consent was obtained, and patient was prepped using Betadine solution.  Lidocaine  jelly was administered per urethral meatus.     Pre-Procedure: - Inspection reveals a normal caliber urethral meatus.  Procedure: The flexible cystoscope was introduced without difficulty - No urethral strictures/lesions are present. -Prominent lateral lobe enlargement prostate  - Elevated bladder neck - Bilateral ureteral orifices identified - Bladder mucosa  reveals no ulcers, tumors, or lesions - No bladder stones -Mild trabeculation  Retroflexion shows no tumor or intravesical median lobe   Post-Procedure: - Patient tolerated the procedure well  Assessment/ Plan:  1.  BPH with LUTS Prostate volume calculated 70 cc Most bothersome symptom is nocturia x 5 Scheduled to see his sleep apnea MD next month and is going to undergo a repeat sleep study PA follow-up 3 months for reassessment  2.  Microhematuria Most likely secondary to BPH Punctate renal calculus on CTU  3.  Scrotal pain Epididymal and intratesticular cyst on ultrasound.  Unlikely a source of his pain   Geraline Knapp, MD

## 2024-03-15 ENCOUNTER — Ambulatory Visit
Admission: EM | Admit: 2024-03-15 | Discharge: 2024-03-15 | Disposition: A | Discharge status: Home / Self Care | Attending: Physician Assistant | Admitting: Physician Assistant

## 2024-03-15 ENCOUNTER — Ambulatory Visit: Admitting: Physician Assistant

## 2024-03-15 DIAGNOSIS — I10 Essential (primary) hypertension: Secondary | ICD-10-CM

## 2024-03-15 DIAGNOSIS — H5713 Ocular pain, bilateral: Secondary | ICD-10-CM

## 2024-03-15 DIAGNOSIS — H1013 Acute atopic conjunctivitis, bilateral: Secondary | ICD-10-CM | POA: Diagnosis not present

## 2024-03-15 MED ORDER — NAPHAZOLINE-PHENIRAMINE 0.025-0.3 % OP SOLN: 1.0000 [drp] | Freq: Four times a day (QID) | OPHTHALMIC | 0 refills | Status: AC | PRN

## 2024-03-15 MED ORDER — KETOROLAC TROMETHAMINE 0.5 % OP SOLN: 1.0000 [drp] | Freq: Four times a day (QID) | OPHTHALMIC | 0 refills | Status: AC | PRN

## 2024-03-15 NOTE — ED Provider Notes (Signed)
 [facility]    CSN: [id number] Arrival date & time: 03/15/24  1033      History   Chief Complaint Chief Complaint  Patient presents with   Eye Problem    HPI Kyle Cross is a 58 y.o. male with history of hypertension, hypertensive heart disease, narcolepsy, OSA, GERD, obesity, and allergies.  Patient presents for bilateral eye redness, watering, itching and pain for the past couple days.  Patient also reports sneezing and congestion.  Denies any injury to eyes.  Right eye is worse than left.  Denies any headaches or dizziness, chest pain, shortness of breath, cough, palpitations.  Has tried to flush eyes without relief.  Reports foreign body sensation.  Of note he has a follow-up appointment with his primary care provider in 4 days regarding hypertension.  Blood pressure elevated 175/98 today.   HPI  Past Medical History:  Diagnosis Date   Dyspnea    Fatigue    GERD (gastroesophageal reflux disease)    History of cardiac catheterization    a. 04/2014 Cath: Nl cors. EF 70%.   HTN (hypertension)    Hypertensive cardiovascular disease    a. 04/2014 Cath: Nl cors. EF 70%. 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 11/2015 cMRI: Limited imaging due to claustrophobia. EF 61%, mod conc LVH. No late gad images obatined, but findings felt to be more consistent w/ HTN heart dzs, no HCM; c. 04/2019 Echo: EF 55-60%, sev LVH, GrII DD, nl RV fxn, triv effusion, trace MR, mild Ao sclerosis.   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)   OSA on CPAP     Patient Active Problem List   Diagnosis Date Noted   Chronic diastolic heart failure (HCC) 08/27/2014   Morbid obesity (HCC) 08/27/2014   Obesity-BMI 38 05/14/2014   GERD (gastroesophageal reflux disease)    OSA on CPAP    Hypertrophic cardiomyopathy (HCC)    Benign essential HTN 05/07/2014   Neuropathy-2012- felt to be infectious (viral) 05/07/2014    Past Surgical History:   Procedure Laterality Date   CARDIAC CATHETERIZATION  05/07/2014   CATARACT EXTRACTION     LEFT HEART CATHETERIZATION WITH CORONARY ANGIOGRAM N/A 05/07/2014   Procedure: LEFT HEART CATHETERIZATION WITH CORONARY ANGIOGRAM;  Surgeon: Victory LELON Claudene DOUGLAS, MD;  Location: [facility] CATH LAB;  Service: Cardiovascular;  Laterality: N/A;   SCROTAL EXPLORATION  03/29/2011   foliculitis   VASECTOMY  02/26/1998       Home Medications    Prior to Admission medications   Medication Sig Start Date End Date Taking? Authorizing Provider  hydrochlorothiazide  (HYDRODIURIL ) 12.5 MG tablet Take 12.5 mg by mouth daily.   Yes [provider]  ketorolac  (ACULAR ) 0.5 % ophthalmic solution Place 1 drop into both eyes 4 (four) times daily as needed (eye pain). 03/15/24  Yes Arvis Jolan NOVAK, PA-C  losartan  (COZAAR ) 50 MG tablet Take 50 mg by mouth daily.   Yes [provider]  naphazoline-pheniramine (NAPHCON-A) 0.025-0.3 % ophthalmic solution Place 1 drop into both eyes 4 (four) times daily as needed for eye irritation or allergies. 03/15/24  Yes Arvis Jolan B, PA-C  sildenafil (REVATIO) 20 MG tablet Take 2-5 tablets by mouth as directed. 30-60 mins before sex 10/16/15   [provider]  tadalafil  (CIALIS ) 20 MG tablet 1-2 TABS 1 HOUR PRIOR TO INTERCOURSE 10/28/23   Stoioff, Glendia BROCKS, MD  Vibegron  (GEMTESA ) 75 MG TABS Take 1 tablet (75 mg total)  by mouth daily. 10/07/23   Twylla Glendia BROCKS, MD    Family History Family History  Problem Relation Age of Onset   Diabetes Father    Hypertension Mother    Heart attack Neg Hx     Social History Social History   Tobacco Use   Smoking status: Never   Smokeless tobacco: Never  Substance Use Topics   Alcohol use: Yes    Alcohol/week: 1.0 standard drink of alcohol    Types: 1 Glasses of wine per week   Drug use: No     Allergies   Shrimp [shellfish allergy], Metoprolol  tartrate, and Shellfish-derived products   Review of Systems Review of  Systems  Constitutional:  Negative for fatigue and fever.  HENT:  Positive for congestion and sneezing. Negative for rhinorrhea.   Eyes:  Positive for discharge, redness and itching. Negative for photophobia, pain and visual disturbance.  Respiratory:  Negative for cough and shortness of breath.   Cardiovascular:  Negative for chest pain and palpitations.  Neurological:  Negative for dizziness and headaches.     Physical Exam Triage Vital Signs ED Triage Vitals  Encounter Vitals Group     BP      Girls Systolic BP Percentile      Girls Diastolic BP Percentile      Boys Systolic BP Percentile      Boys Diastolic BP Percentile      Pulse      Resp      Temp      Temp src      SpO2      Weight      Height      Head Circumference      Peak Flow      Pain Score      Pain Loc      Pain Education      Exclude from Growth Chart    No data found.  Updated Vital Signs BP (!) 175/98 (BP Location: Right Wrist)   Pulse (!) 59   Temp 98.6 F (37 C) (Oral)   Resp 17   Wt 299 lb (135.6 kg)   SpO2 94%   BMI 45.46 kg/m   Visual Acuity Right Eye Distance: 20/70 Left Eye Distance: 20/50 Bilateral Distance: 20/50  Physical Exam Vitals and nursing note reviewed.  Constitutional:      General: He is not in acute distress.    Appearance: Normal appearance. He is well-developed. He is not ill-appearing.  HENT:     Head: Normocephalic and atraumatic.     Nose: Congestion present.     Mouth/Throat:     Mouth: Mucous membranes are moist.     Pharynx: Oropharynx is clear.  Eyes:     General: No scleral icterus.    Conjunctiva/sclera:     Right eye: Right conjunctiva is injected.     Left eye: Left conjunctiva is injected.     Comments: Watery drainage bilaterally  Cardiovascular:     Rate and Rhythm: Normal rate and regular rhythm.  Pulmonary:     Effort: Pulmonary effort is normal. No respiratory distress.     Breath sounds: Normal breath sounds.  Musculoskeletal:      Cervical back: Neck supple.  Skin:    General: Skin is warm and dry.     Capillary Refill: Capillary refill takes less than 2 seconds.  Neurological:     General: No focal deficit present.     Mental Status: He is alert. Mental  status is at baseline.     Motor: No weakness.     Gait: Gait normal.  Psychiatric:        Mood and Affect: Mood normal.      UC Treatments / Results  Labs (all labs ordered are listed, but only abnormal results are displayed) Labs Reviewed - No data to display  EKG   Radiology No results found.  Procedures Procedures (including critical care time)  Medications Ordered in UC Medications - No data to display  Initial Impression / Assessment and Plan / UC Course  I have reviewed the triage vital signs and the nursing notes.  Pertinent labs & imaging results that were available during my care of the patient were reviewed by me and considered in my medical decision making (see chart for details).   58 year old male presents for bilateral eye redness, pain, irritation and clear watery drainage for the past couple days.  Also reports sneezing and congestion.  Vision grossly intact.  20/50 bilaterally and left eye is also 20/50.  Right eye 20/70.  On exam he has significant diffuse conjunctival injection of bilateral eyes, right worse than left.  Mild nasal congestion.  Allergic conjunctivitis most likely.  Sent Naphcon-A and ketorolac  eyedrops.  Advised him to start a daily antihistamine.  In regards to his high blood pressure 175/98, should continue current medications and keep follow-up appoint with PCP in the next few days.  ED if any severe headaches, dizziness, chest pain, breathing problem.   Final Clinical Impressions(s) / UC Diagnoses   Final diagnoses:  Allergic conjunctivitis of both eyes  Eye pain, bilateral  Essential hypertension     Discharge Instructions      - Your eye redness is likely related to allergies.  I sent 2  different eyedrops to the pharmacy for you. - Use cool compresses and start taking daily antihistamine like Claritin  or Zyrtec. - Your blood pressure is really high at 175/98.  Blood pressure should not be above 140/90.  Please check your blood pressure at home and follow-up with your primary care provider about this.  You may need to start medication.  Consider losing weight if you are overweight and start having a healthier diet.  Reduce sodium intake.  Exercise every day.     ED Prescriptions     Medication Sig Dispense Auth. Provider   ketorolac  (ACULAR ) 0.5 % ophthalmic solution Place 1 drop into both eyes 4 (four) times daily as needed (eye pain). 5 mL Arvis Huxley B, PA-C   naphazoline-pheniramine (NAPHCON-A) 0.025-0.3 % ophthalmic solution Place 1 drop into both eyes 4 (four) times daily as needed for eye irritation or allergies. 15 mL Arvis Huxley NOVAK, PA-C      PDMP not reviewed this encounter.   Arvis Huxley NOVAK, PA-C 03/15/24 [id number]

## 2024-03-15 NOTE — Discharge Instructions (Addendum)
-   Your eye redness is likely related to allergies.  I sent 2 different eyedrops to the pharmacy for you. - Use cool compresses and start taking daily antihistamine like Claritin  or Zyrtec. - Your blood pressure is really high at 175/98.  Blood pressure should not be above 140/90.  Please check your blood pressure at home and follow-up with your primary care provider about this.  You may need to start medication.  Consider losing weight if you are overweight and start having a healthier diet.  Reduce sodium intake.  Exercise every day.

## 2024-03-15 NOTE — ED Triage Notes (Addendum)
 Patient states that he had eye surgery 2 months ago. Cataract  surgery. Patient states that he felt like something was in huis eye yesterday and tried to wash it it. Patient states that the eye is sore. Bilateral eye pain and redness. Right eye is worse. Patient stats that he hasn't called his eye dr.
